# Patient Record
Sex: Male | Born: 1980 | Race: White | Hispanic: No | Marital: Single | Smoking: Current every day smoker
Health system: Southern US, Community
[De-identification: ages and names within clinical notes are randomized; demographics above are authoritative.]

## PROBLEM LIST (undated history)

## (undated) DIAGNOSIS — F71 Moderate intellectual disabilities: Secondary | ICD-10-CM

## (undated) DIAGNOSIS — N289 Disorder of kidney and ureter, unspecified: Secondary | ICD-10-CM

## (undated) DIAGNOSIS — F209 Schizophrenia, unspecified: Secondary | ICD-10-CM

## (undated) DIAGNOSIS — G4730 Sleep apnea, unspecified: Secondary | ICD-10-CM

## (undated) DIAGNOSIS — I82409 Acute embolism and thrombosis of unspecified deep veins of unspecified lower extremity: Secondary | ICD-10-CM

## (undated) DIAGNOSIS — I10 Essential (primary) hypertension: Secondary | ICD-10-CM

## (undated) DIAGNOSIS — G210 Malignant neuroleptic syndrome: Secondary | ICD-10-CM

## (undated) DIAGNOSIS — D709 Neutropenia, unspecified: Secondary | ICD-10-CM

## (undated) DIAGNOSIS — Q984 Klinefelter syndrome, unspecified: Secondary | ICD-10-CM

## (undated) DIAGNOSIS — F419 Anxiety disorder, unspecified: Secondary | ICD-10-CM

---

## 2001-12-15 ENCOUNTER — Emergency Department (HOSPITAL_COMMUNITY): Admission: EM | Admit: 2001-12-15 | Discharge: 2001-12-15 | Payer: Self-pay

## 2001-12-24 ENCOUNTER — Emergency Department (HOSPITAL_COMMUNITY): Admission: AC | Admit: 2001-12-24 | Discharge: 2001-12-24 | Payer: Self-pay

## 2002-04-30 ENCOUNTER — Emergency Department (HOSPITAL_COMMUNITY): Admission: EM | Admit: 2002-04-30 | Discharge: 2002-04-30 | Payer: Self-pay | Admitting: Emergency Medicine

## 2002-04-30 ENCOUNTER — Encounter: Payer: Self-pay | Admitting: Emergency Medicine

## 2002-10-07 ENCOUNTER — Inpatient Hospital Stay (HOSPITAL_COMMUNITY): Admission: EM | Admit: 2002-10-07 | Discharge: 2002-10-10 | Payer: Self-pay | Admitting: Emergency Medicine

## 2002-11-02 ENCOUNTER — Emergency Department (HOSPITAL_COMMUNITY): Admission: EM | Admit: 2002-11-02 | Discharge: 2002-11-03 | Payer: Self-pay | Admitting: Emergency Medicine

## 2004-06-23 ENCOUNTER — Emergency Department (HOSPITAL_COMMUNITY): Admission: EM | Admit: 2004-06-23 | Discharge: 2004-06-24 | Payer: Self-pay | Admitting: Emergency Medicine

## 2004-11-23 ENCOUNTER — Emergency Department (HOSPITAL_COMMUNITY): Admission: EM | Admit: 2004-11-23 | Discharge: 2004-11-23 | Payer: Self-pay | Admitting: Family Medicine

## 2004-12-01 ENCOUNTER — Emergency Department (HOSPITAL_COMMUNITY): Admission: EM | Admit: 2004-12-01 | Discharge: 2004-12-01 | Payer: Self-pay | Admitting: Family Medicine

## 2005-02-08 ENCOUNTER — Ambulatory Visit: Payer: Self-pay | Admitting: Pulmonary Disease

## 2005-02-28 ENCOUNTER — Ambulatory Visit (HOSPITAL_BASED_OUTPATIENT_CLINIC_OR_DEPARTMENT_OTHER): Admission: RE | Admit: 2005-02-28 | Discharge: 2005-02-28 | Payer: Self-pay | Admitting: Pulmonary Disease

## 2005-03-08 ENCOUNTER — Ambulatory Visit: Payer: Self-pay | Admitting: Pulmonary Disease

## 2005-05-15 ENCOUNTER — Emergency Department (HOSPITAL_COMMUNITY): Admission: EM | Admit: 2005-05-15 | Discharge: 2005-05-15 | Payer: Self-pay | Admitting: Family Medicine

## 2005-05-25 ENCOUNTER — Ambulatory Visit: Payer: Self-pay | Admitting: Pulmonary Disease

## 2005-06-05 ENCOUNTER — Ambulatory Visit (HOSPITAL_BASED_OUTPATIENT_CLINIC_OR_DEPARTMENT_OTHER): Admission: RE | Admit: 2005-06-05 | Discharge: 2005-06-05 | Payer: Self-pay | Admitting: Pulmonary Disease

## 2005-06-26 ENCOUNTER — Ambulatory Visit: Payer: Self-pay | Admitting: Pulmonary Disease

## 2005-07-09 ENCOUNTER — Emergency Department (HOSPITAL_COMMUNITY): Admission: EM | Admit: 2005-07-09 | Discharge: 2005-07-09 | Payer: Self-pay | Admitting: Emergency Medicine

## 2005-07-26 ENCOUNTER — Ambulatory Visit: Payer: Self-pay | Admitting: Pulmonary Disease

## 2005-09-01 ENCOUNTER — Ambulatory Visit: Payer: Self-pay | Admitting: Pulmonary Disease

## 2006-02-28 ENCOUNTER — Encounter: Admission: RE | Admit: 2006-02-28 | Discharge: 2006-02-28 | Payer: Self-pay | Admitting: Nephrology

## 2006-04-27 ENCOUNTER — Ambulatory Visit: Payer: Self-pay

## 2007-01-19 ENCOUNTER — Emergency Department (HOSPITAL_COMMUNITY): Admission: EM | Admit: 2007-01-19 | Discharge: 2007-01-19 | Payer: Self-pay | Admitting: Emergency Medicine

## 2007-01-30 DIAGNOSIS — G4730 Sleep apnea, unspecified: Secondary | ICD-10-CM | POA: Insufficient documentation

## 2007-01-30 DIAGNOSIS — G210 Malignant neuroleptic syndrome: Secondary | ICD-10-CM

## 2007-01-30 DIAGNOSIS — J45909 Unspecified asthma, uncomplicated: Secondary | ICD-10-CM | POA: Insufficient documentation

## 2007-01-30 DIAGNOSIS — F329 Major depressive disorder, single episode, unspecified: Secondary | ICD-10-CM

## 2007-01-30 DIAGNOSIS — F319 Bipolar disorder, unspecified: Secondary | ICD-10-CM

## 2007-01-30 DIAGNOSIS — Q984 Klinefelter syndrome, unspecified: Secondary | ICD-10-CM

## 2007-01-30 DIAGNOSIS — F259 Schizoaffective disorder, unspecified: Secondary | ICD-10-CM | POA: Insufficient documentation

## 2007-01-30 DIAGNOSIS — F6381 Intermittent explosive disorder: Secondary | ICD-10-CM

## 2007-04-10 ENCOUNTER — Emergency Department (HOSPITAL_COMMUNITY): Admission: EM | Admit: 2007-04-10 | Discharge: 2007-04-11 | Payer: Self-pay | Admitting: Emergency Medicine

## 2007-06-03 ENCOUNTER — Emergency Department (HOSPITAL_COMMUNITY): Admission: EM | Admit: 2007-06-03 | Discharge: 2007-06-03 | Payer: Self-pay | Admitting: Family Medicine

## 2007-06-27 ENCOUNTER — Emergency Department (HOSPITAL_COMMUNITY): Admission: EM | Admit: 2007-06-27 | Discharge: 2007-06-27 | Payer: Self-pay | Admitting: Family Medicine

## 2007-07-30 ENCOUNTER — Encounter: Payer: Self-pay | Admitting: Pulmonary Disease

## 2007-08-20 ENCOUNTER — Emergency Department (HOSPITAL_COMMUNITY): Admission: EM | Admit: 2007-08-20 | Discharge: 2007-08-20 | Payer: Self-pay | Admitting: Emergency Medicine

## 2007-08-30 ENCOUNTER — Encounter: Payer: Self-pay | Admitting: Pulmonary Disease

## 2007-09-26 ENCOUNTER — Emergency Department (HOSPITAL_COMMUNITY): Admission: EM | Admit: 2007-09-26 | Discharge: 2007-09-26 | Payer: Self-pay | Admitting: Family Medicine

## 2007-11-10 ENCOUNTER — Emergency Department (HOSPITAL_COMMUNITY): Admission: EM | Admit: 2007-11-10 | Discharge: 2007-11-10 | Payer: Self-pay | Admitting: Emergency Medicine

## 2008-05-28 ENCOUNTER — Emergency Department (HOSPITAL_COMMUNITY): Admission: EM | Admit: 2008-05-28 | Discharge: 2008-05-28 | Payer: Self-pay | Admitting: Family Medicine

## 2008-06-23 ENCOUNTER — Emergency Department (HOSPITAL_COMMUNITY): Admission: EM | Admit: 2008-06-23 | Discharge: 2008-06-24 | Payer: Self-pay | Admitting: Emergency Medicine

## 2008-09-25 ENCOUNTER — Observation Stay (HOSPITAL_COMMUNITY): Admission: EM | Admit: 2008-09-25 | Discharge: 2008-09-26 | Payer: Self-pay | Admitting: Emergency Medicine

## 2008-10-09 ENCOUNTER — Encounter (HOSPITAL_BASED_OUTPATIENT_CLINIC_OR_DEPARTMENT_OTHER): Admission: RE | Admit: 2008-10-09 | Discharge: 2008-11-18 | Payer: Self-pay | Admitting: General Surgery

## 2008-10-26 ENCOUNTER — Emergency Department (HOSPITAL_COMMUNITY): Admission: EM | Admit: 2008-10-26 | Discharge: 2008-11-04 | Payer: Self-pay | Admitting: Emergency Medicine

## 2008-10-28 ENCOUNTER — Ambulatory Visit: Payer: Self-pay | Admitting: Vascular Surgery

## 2008-10-29 ENCOUNTER — Encounter (INDEPENDENT_AMBULATORY_CARE_PROVIDER_SITE_OTHER): Payer: Self-pay | Admitting: Emergency Medicine

## 2008-10-31 ENCOUNTER — Ambulatory Visit: Payer: Self-pay | Admitting: Psychiatry

## 2008-11-20 ENCOUNTER — Encounter (HOSPITAL_BASED_OUTPATIENT_CLINIC_OR_DEPARTMENT_OTHER): Admission: RE | Admit: 2008-11-20 | Discharge: 2009-02-18 | Payer: Self-pay | Admitting: General Surgery

## 2008-11-28 ENCOUNTER — Emergency Department (HOSPITAL_COMMUNITY): Admission: EM | Admit: 2008-11-28 | Discharge: 2008-11-29 | Payer: Self-pay | Admitting: Emergency Medicine

## 2008-12-16 ENCOUNTER — Inpatient Hospital Stay (HOSPITAL_COMMUNITY): Admission: EM | Admit: 2008-12-16 | Discharge: 2008-12-18 | Payer: Self-pay | Admitting: Emergency Medicine

## 2008-12-20 ENCOUNTER — Emergency Department (HOSPITAL_COMMUNITY): Admission: EM | Admit: 2008-12-20 | Discharge: 2008-12-20 | Payer: Self-pay | Admitting: Emergency Medicine

## 2008-12-31 ENCOUNTER — Inpatient Hospital Stay (HOSPITAL_COMMUNITY): Admission: AD | Admit: 2008-12-31 | Discharge: 2008-12-31 | Payer: Self-pay | Admitting: Obstetrics & Gynecology

## 2009-01-09 ENCOUNTER — Emergency Department (HOSPITAL_COMMUNITY): Admission: EM | Admit: 2009-01-09 | Discharge: 2009-01-11 | Payer: Self-pay | Admitting: Emergency Medicine

## 2009-01-28 ENCOUNTER — Inpatient Hospital Stay (HOSPITAL_COMMUNITY): Admission: EM | Admit: 2009-01-28 | Discharge: 2009-02-06 | Payer: Self-pay | Admitting: Emergency Medicine

## 2009-03-10 ENCOUNTER — Encounter (HOSPITAL_BASED_OUTPATIENT_CLINIC_OR_DEPARTMENT_OTHER): Admission: RE | Admit: 2009-03-10 | Discharge: 2009-06-08 | Payer: Self-pay | Admitting: General Surgery

## 2009-05-26 ENCOUNTER — Emergency Department (HOSPITAL_COMMUNITY): Admission: EM | Admit: 2009-05-26 | Discharge: 2009-05-26 | Payer: Self-pay | Admitting: Emergency Medicine

## 2009-05-26 ENCOUNTER — Encounter (INDEPENDENT_AMBULATORY_CARE_PROVIDER_SITE_OTHER): Payer: Self-pay | Admitting: Internal Medicine

## 2009-05-26 ENCOUNTER — Ambulatory Visit: Payer: Self-pay | Admitting: Vascular Surgery

## 2009-06-02 ENCOUNTER — Emergency Department (HOSPITAL_COMMUNITY): Admission: EM | Admit: 2009-06-02 | Discharge: 2009-06-03 | Payer: Self-pay | Admitting: Emergency Medicine

## 2009-06-10 ENCOUNTER — Encounter (HOSPITAL_BASED_OUTPATIENT_CLINIC_OR_DEPARTMENT_OTHER): Admission: RE | Admit: 2009-06-10 | Discharge: 2009-09-08 | Payer: Self-pay | Admitting: General Surgery

## 2009-07-15 ENCOUNTER — Emergency Department (HOSPITAL_COMMUNITY): Admission: EM | Admit: 2009-07-15 | Discharge: 2009-07-15 | Payer: Self-pay | Admitting: Emergency Medicine

## 2009-08-16 ENCOUNTER — Emergency Department (HOSPITAL_COMMUNITY): Admission: EM | Admit: 2009-08-16 | Discharge: 2009-08-16 | Payer: Self-pay | Admitting: Emergency Medicine

## 2009-09-08 ENCOUNTER — Emergency Department (HOSPITAL_COMMUNITY): Admission: EM | Admit: 2009-09-08 | Discharge: 2009-09-08 | Payer: Self-pay | Admitting: Emergency Medicine

## 2009-10-29 ENCOUNTER — Emergency Department (HOSPITAL_COMMUNITY): Admission: EM | Admit: 2009-10-29 | Discharge: 2009-10-29 | Payer: Self-pay | Admitting: Emergency Medicine

## 2009-10-31 ENCOUNTER — Inpatient Hospital Stay (HOSPITAL_COMMUNITY): Admission: EM | Admit: 2009-10-31 | Discharge: 2009-11-16 | Payer: Self-pay | Admitting: Emergency Medicine

## 2009-11-15 ENCOUNTER — Ambulatory Visit: Payer: Self-pay | Admitting: Psychiatry

## 2009-12-24 ENCOUNTER — Emergency Department (HOSPITAL_COMMUNITY): Admission: EM | Admit: 2009-12-24 | Discharge: 2009-12-25 | Payer: Self-pay | Admitting: Emergency Medicine

## 2010-01-21 ENCOUNTER — Ambulatory Visit: Payer: Self-pay | Admitting: Psychiatry

## 2010-02-15 ENCOUNTER — Emergency Department (HOSPITAL_COMMUNITY)
Admission: EM | Admit: 2010-02-15 | Discharge: 2010-02-16 | Payer: Self-pay | Source: Home / Self Care | Admitting: Emergency Medicine

## 2010-03-18 ENCOUNTER — Emergency Department (HOSPITAL_BASED_OUTPATIENT_CLINIC_OR_DEPARTMENT_OTHER)
Admission: EM | Admit: 2010-03-18 | Discharge: 2010-03-18 | Payer: Self-pay | Source: Home / Self Care | Admitting: Emergency Medicine

## 2010-03-24 ENCOUNTER — Ambulatory Visit (HOSPITAL_BASED_OUTPATIENT_CLINIC_OR_DEPARTMENT_OTHER)
Admission: RE | Admit: 2010-03-24 | Discharge: 2010-03-24 | Payer: Self-pay | Source: Home / Self Care | Attending: Internal Medicine | Admitting: Internal Medicine

## 2010-05-23 LAB — COMPREHENSIVE METABOLIC PANEL
ALT: 29 U/L (ref 0–53)
AST: 28 U/L (ref 0–37)
Albumin: 4 g/dL (ref 3.5–5.2)
BUN: 16 mg/dL (ref 6–23)
Creatinine, Ser: 1.65 mg/dL — ABNORMAL HIGH (ref 0.4–1.5)
GFR calc Af Amer: 60 mL/min — ABNORMAL LOW (ref >60)
GFR calc non Af Amer: 50 mL/min — ABNORMAL LOW (ref >60)
Potassium: 4.3 mEq/L (ref 3.5–5.1)
Sodium: 146 mEq/L — ABNORMAL HIGH (ref 135–145)
Total Bilirubin: 0.7 mg/dL (ref 0.3–1.2)
Total Protein: 8 g/dL (ref 6.0–8.3)

## 2010-05-23 LAB — DIFFERENTIAL
Basophils Absolute: 0 10*3/uL (ref 0.0–0.1)
Basophils Relative: 0 % (ref 0–1)
Eosinophils Absolute: 0 10*3/uL (ref 0.0–0.7)
Lymphs Abs: 2.3 10*3/uL (ref 0.7–4.0)
Neutro Abs: 5.7 10*3/uL (ref 1.7–7.7)

## 2010-05-23 LAB — URINALYSIS, ROUTINE W REFLEX MICROSCOPIC
Bilirubin Urine: NEGATIVE
Hgb urine dipstick: NEGATIVE
Leukocytes, UA: NEGATIVE
Urobilinogen, UA: 0.2 mg/dL (ref 0.0–1.0)
pH: 6.5 (ref 5.0–8.0)

## 2010-05-23 LAB — URINE MICROSCOPIC-ADD ON

## 2010-05-23 LAB — CBC
HCT: 41.1 % (ref 39.0–52.0)
Hemoglobin: 13.9 g/dL (ref 13.0–17.0)
Platelets: 182 10*3/uL (ref 150–400)
RDW: 15.2 % (ref 11.5–15.5)
WBC: 8.6 10*3/uL (ref 4.0–10.5)

## 2010-05-23 LAB — RAPID URINE DRUG SCREEN, HOSP PERFORMED
Barbiturates: NOT DETECTED
Opiates: NOT DETECTED

## 2010-05-23 LAB — ACETAMINOPHEN LEVEL: Acetaminophen (Tylenol), Serum: <10 ug/mL — ABNORMAL LOW (ref 10–30)

## 2010-05-26 LAB — CBC
HCT: 29.9 % — ABNORMAL LOW (ref 39.0–52.0)
HCT: 30.4 % — ABNORMAL LOW (ref 39.0–52.0)
HCT: 33 % — ABNORMAL LOW (ref 39.0–52.0)
Hemoglobin: 10.1 g/dL — ABNORMAL LOW (ref 13.0–17.0)
Hemoglobin: 11.1 g/dL — ABNORMAL LOW (ref 13.0–17.0)
Hemoglobin: 11.4 g/dL — ABNORMAL LOW (ref 13.0–17.0)
MCH: 26.2 pg (ref 26.0–34.0)
MCH: 26.5 pg (ref 26.0–34.0)
MCH: 26.6 pg (ref 26.0–34.0)
MCH: 26.7 pg (ref 26.0–34.0)
MCHC: 33.6 g/dL (ref 30.0–36.0)
MCHC: 34 g/dL (ref 30.0–36.0)
MCHC: 34 g/dL (ref 30.0–36.0)
MCV: 77.8 fL — ABNORMAL LOW (ref 78.0–100.0)
MCV: 78.4 fL (ref 78.0–100.0)
Platelets: 124 10*3/uL — ABNORMAL LOW (ref 150–400)
Platelets: 128 10*3/uL — ABNORMAL LOW (ref 150–400)
RBC: 3.85 MIL/uL — ABNORMAL LOW (ref 4.22–5.81)
RBC: 3.89 MIL/uL — ABNORMAL LOW (ref 4.22–5.81)
RBC: 4.21 MIL/uL — ABNORMAL LOW (ref 4.22–5.81)
RDW: 16.6 % — ABNORMAL HIGH (ref 11.5–15.5)
RDW: 16.7 % — ABNORMAL HIGH (ref 11.5–15.5)
RDW: 17 % — ABNORMAL HIGH (ref 11.5–15.5)
WBC: 4.5 10*3/uL (ref 4.0–10.5)
WBC: 4.6 10*3/uL (ref 4.0–10.5)

## 2010-05-26 LAB — RENAL FUNCTION PANEL
BUN: 24 mg/dL — ABNORMAL HIGH (ref 6–23)
BUN: 24 mg/dL — ABNORMAL HIGH (ref 6–23)
CO2: 22 mEq/L (ref 19–32)
CO2: 24 mEq/L (ref 19–32)
Calcium: 8.8 mg/dL (ref 8.4–10.5)
Calcium: 9.3 mg/dL (ref 8.4–10.5)
Calcium: 9.4 mg/dL (ref 8.4–10.5)
Chloride: 117 mEq/L — ABNORMAL HIGH (ref 96–112)
GFR calc Af Amer: 16 mL/min — ABNORMAL LOW (ref >60)
GFR calc Af Amer: 16 mL/min — ABNORMAL LOW (ref >60)
GFR calc non Af Amer: 13 mL/min — ABNORMAL LOW (ref >60)
GFR calc non Af Amer: 13 mL/min — ABNORMAL LOW (ref >60)
Glucose, Bld: 100 mg/dL — ABNORMAL HIGH (ref 70–99)
Glucose, Bld: 108 mg/dL — ABNORMAL HIGH (ref 70–99)
Glucose, Bld: 93 mg/dL (ref 70–99)
Phosphorus: 5 mg/dL — ABNORMAL HIGH (ref 2.3–4.6)
Phosphorus: 5.4 mg/dL — ABNORMAL HIGH (ref 2.3–4.6)
Phosphorus: 6 mg/dL — ABNORMAL HIGH (ref 2.3–4.6)
Potassium: 4 mEq/L (ref 3.5–5.1)
Potassium: 4.7 mEq/L (ref 3.5–5.1)
Sodium: 147 mEq/L — ABNORMAL HIGH (ref 135–145)
Sodium: 148 mEq/L — ABNORMAL HIGH (ref 135–145)

## 2010-05-26 LAB — PROTIME-INR
INR: 3.2 — ABNORMAL HIGH (ref 0.00–1.49)
Prothrombin Time: 18 seconds — ABNORMAL HIGH (ref 11.6–15.2)
Prothrombin Time: 18.5 seconds — ABNORMAL HIGH (ref 11.6–15.2)
Prothrombin Time: 18.9 seconds — ABNORMAL HIGH (ref 11.6–15.2)

## 2010-05-26 LAB — BASIC METABOLIC PANEL
CO2: 20 mEq/L (ref 19–32)
CO2: 25 mEq/L (ref 19–32)
Calcium: 8.6 mg/dL (ref 8.4–10.5)
Calcium: 9.1 mg/dL (ref 8.4–10.5)
Chloride: 115 mEq/L — ABNORMAL HIGH (ref 96–112)
Chloride: 123 mEq/L — ABNORMAL HIGH (ref 96–112)
Creatinine, Ser: 5.32 mg/dL — ABNORMAL HIGH (ref 0.4–1.5)
GFR calc non Af Amer: 13 mL/min — ABNORMAL LOW (ref >60)
Glucose, Bld: 90 mg/dL (ref 70–99)
Glucose, Bld: 96 mg/dL (ref 70–99)
Sodium: 145 mEq/L (ref 135–145)

## 2010-05-26 LAB — DIFFERENTIAL
Basophils Absolute: 0 10*3/uL (ref 0.0–0.1)
Lymphocytes Relative: 38 % (ref 12–46)
Lymphs Abs: 1.7 10*3/uL (ref 0.7–4.0)
Monocytes Absolute: 0.4 10*3/uL (ref 0.1–1.0)
Neutro Abs: 2.4 10*3/uL (ref 1.7–7.7)

## 2010-05-26 LAB — ALBUMIN: Albumin: 3 g/dL — ABNORMAL LOW (ref 3.5–5.2)

## 2010-05-27 LAB — DIFFERENTIAL
Band Neutrophils: 0 % (ref 0–10)
Basophils Absolute: 0 10*3/uL (ref 0.0–0.1)
Basophils Absolute: 0 10*3/uL (ref 0.0–0.1)
Basophils Absolute: 0 10*3/uL (ref 0.0–0.1)
Basophils Absolute: 0 10*3/uL (ref 0.0–0.1)
Basophils Absolute: 0 10*3/uL (ref 0.0–0.1)
Basophils Absolute: 0 10*3/uL (ref 0.0–0.1)
Basophils Relative: 0 % (ref 0–1)
Basophils Relative: 0 % (ref 0–1)
Basophils Relative: 0 % (ref 0–1)
Eosinophils Absolute: 0 10*3/uL (ref 0.0–0.7)
Eosinophils Absolute: 0 10*3/uL (ref 0.0–0.7)
Eosinophils Absolute: 0 10*3/uL (ref 0.0–0.7)
Eosinophils Absolute: 0 10*3/uL (ref 0.0–0.7)
Eosinophils Absolute: 0 10*3/uL (ref 0.0–0.7)
Eosinophils Relative: 0 % (ref 0–5)
Eosinophils Relative: 0 % (ref 0–5)
Eosinophils Relative: 0 % (ref 0–5)
Eosinophils Relative: 0 % (ref 0–5)
Lymphocytes Relative: 0 % — ABNORMAL LOW (ref 12–46)
Lymphocytes Relative: 22 % (ref 12–46)
Lymphocytes Relative: 88 % — ABNORMAL HIGH (ref 12–46)
Lymphocytes Relative: 98 % — ABNORMAL HIGH (ref 12–46)
Lymphs Abs: 0 10*3/uL — ABNORMAL LOW (ref 0.7–4.0)
Lymphs Abs: 1.2 10*3/uL (ref 0.7–4.0)
Lymphs Abs: 1.3 10*3/uL (ref 0.7–4.0)
Lymphs Abs: 1.5 10*3/uL (ref 0.7–4.0)
Monocytes Absolute: 0 10*3/uL — ABNORMAL LOW (ref 0.1–1.0)
Monocytes Absolute: 0 10*3/uL — ABNORMAL LOW (ref 0.1–1.0)
Monocytes Absolute: 0 10*3/uL — ABNORMAL LOW (ref 0.1–1.0)
Monocytes Relative: 0 % — ABNORMAL LOW (ref 3–12)
Monocytes Relative: 1 % — ABNORMAL LOW (ref 3–12)
Monocytes Relative: 6 % (ref 3–12)
Monocytes Relative: 8 % (ref 3–12)
Neutro Abs: 0 10*3/uL — ABNORMAL LOW (ref 1.7–7.7)
Neutro Abs: 0.1 10*3/uL — ABNORMAL LOW (ref 1.7–7.7)
Neutro Abs: 0.1 10*3/uL — ABNORMAL LOW (ref 1.7–7.7)
Neutro Abs: 8.4 10*3/uL — ABNORMAL HIGH (ref 1.7–7.7)
Neutrophils Relative %: 0 % — ABNORMAL LOW (ref 43–77)
Neutrophils Relative %: 1 % — ABNORMAL LOW (ref 43–77)
Neutrophils Relative %: 4 % — ABNORMAL LOW (ref 43–77)
Neutrophils Relative %: 4 % — ABNORMAL LOW (ref 43–77)
Neutrophils Relative %: 67 % (ref 43–77)
Neutrophils Relative %: 77 % (ref 43–77)
WBC Morphology: INCREASED
nRBC: 0 /100 WBC

## 2010-05-27 LAB — CBC
HCT: 32.5 % — ABNORMAL LOW (ref 39.0–52.0)
HCT: 32.6 % — ABNORMAL LOW (ref 39.0–52.0)
HCT: 33.4 % — ABNORMAL LOW (ref 39.0–52.0)
HCT: 35.5 % — ABNORMAL LOW (ref 39.0–52.0)
HCT: 36.6 % — ABNORMAL LOW (ref 39.0–52.0)
HCT: 39.6 % (ref 39.0–52.0)
Hemoglobin: 10.9 g/dL — ABNORMAL LOW (ref 13.0–17.0)
Hemoglobin: 11.6 g/dL — ABNORMAL LOW (ref 13.0–17.0)
Hemoglobin: 12.2 g/dL — ABNORMAL LOW (ref 13.0–17.0)
Hemoglobin: 12.3 g/dL — ABNORMAL LOW (ref 13.0–17.0)
Hemoglobin: 13.6 g/dL (ref 13.0–17.0)
MCH: 26.3 pg (ref 26.0–34.0)
MCH: 26.7 pg (ref 26.0–34.0)
MCH: 27.3 pg (ref 26.0–34.0)
MCH: 27.6 pg (ref 26.0–34.0)
MCHC: 33.2 g/dL (ref 30.0–36.0)
MCHC: 33.6 g/dL (ref 30.0–36.0)
MCHC: 33.7 g/dL (ref 30.0–36.0)
MCHC: 34 g/dL (ref 30.0–36.0)
MCHC: 34.2 g/dL (ref 30.0–36.0)
MCHC: 34.3 g/dL (ref 30.0–36.0)
MCHC: 34.4 g/dL (ref 30.0–36.0)
MCHC: 34.6 g/dL (ref 30.0–36.0)
MCV: 78.8 fL (ref 78.0–100.0)
MCV: 79.1 fL (ref 78.0–100.0)
MCV: 79.4 fL (ref 78.0–100.0)
MCV: 79.4 fL (ref 78.0–100.0)
MCV: 79.7 fL (ref 78.0–100.0)
Platelets: 102 10*3/uL — ABNORMAL LOW (ref 150–400)
Platelets: 156 10*3/uL (ref 150–400)
Platelets: 172 10*3/uL (ref 150–400)
Platelets: 186 10*3/uL (ref 150–400)
RBC: 3.94 MIL/uL — ABNORMAL LOW (ref 4.22–5.81)
RBC: 4.48 MIL/uL (ref 4.22–5.81)
RBC: 4.57 MIL/uL (ref 4.22–5.81)
RDW: 15.7 % — ABNORMAL HIGH (ref 11.5–15.5)
RDW: 15.7 % — ABNORMAL HIGH (ref 11.5–15.5)
RDW: 15.8 % — ABNORMAL HIGH (ref 11.5–15.5)
RDW: 16 % — ABNORMAL HIGH (ref 11.5–15.5)
RDW: 16.1 % — ABNORMAL HIGH (ref 11.5–15.5)
RDW: 16.1 % — ABNORMAL HIGH (ref 11.5–15.5)
RDW: 16.3 % — ABNORMAL HIGH (ref 11.5–15.5)
WBC: 0.7 10*3/uL — CL (ref 4.0–10.5)
WBC: 1.1 10*3/uL — CL (ref 4.0–10.5)
WBC: 1.2 10*3/uL — CL (ref 4.0–10.5)
WBC: 1.3 10*3/uL — CL (ref 4.0–10.5)
WBC: 10.9 10*3/uL — ABNORMAL HIGH (ref 4.0–10.5)
WBC: 2.2 10*3/uL — ABNORMAL LOW (ref 4.0–10.5)

## 2010-05-27 LAB — BASIC METABOLIC PANEL
BUN: 10 mg/dL (ref 6–23)
BUN: 10 mg/dL (ref 6–23)
BUN: 11 mg/dL (ref 6–23)
BUN: 9 mg/dL (ref 6–23)
BUN: 9 mg/dL (ref 6–23)
CO2: 25 mEq/L (ref 19–32)
Calcium: 8.1 mg/dL — ABNORMAL LOW (ref 8.4–10.5)
Calcium: 8.5 mg/dL (ref 8.4–10.5)
Calcium: 8.7 mg/dL (ref 8.4–10.5)
Calcium: 8.9 mg/dL (ref 8.4–10.5)
Chloride: 109 mEq/L (ref 96–112)
Chloride: 113 mEq/L — ABNORMAL HIGH (ref 96–112)
Creatinine, Ser: 1.42 mg/dL (ref 0.4–1.5)
Creatinine, Ser: 1.45 mg/dL (ref 0.4–1.5)
Creatinine, Ser: 5.04 mg/dL — ABNORMAL HIGH (ref 0.4–1.5)
Creatinine, Ser: 5.54 mg/dL — ABNORMAL HIGH (ref 0.4–1.5)
Creatinine, Ser: 5.79 mg/dL — ABNORMAL HIGH (ref 0.4–1.5)
GFR calc Af Amer: 15 mL/min — ABNORMAL LOW (ref >60)
GFR calc Af Amer: >60 mL/min (ref >60)
GFR calc non Af Amer: 12 mL/min — ABNORMAL LOW (ref >60)
GFR calc non Af Amer: 12 mL/min — ABNORMAL LOW (ref >60)
GFR calc non Af Amer: 14 mL/min — ABNORMAL LOW (ref >60)
GFR calc non Af Amer: 58 mL/min — ABNORMAL LOW (ref >60)
GFR calc non Af Amer: 59 mL/min — ABNORMAL LOW (ref >60)
Glucose, Bld: 101 mg/dL — ABNORMAL HIGH (ref 70–99)
Glucose, Bld: 104 mg/dL — ABNORMAL HIGH (ref 70–99)
Glucose, Bld: 129 mg/dL — ABNORMAL HIGH (ref 70–99)
Potassium: 4.1 mEq/L (ref 3.5–5.1)
Potassium: 4.2 mEq/L (ref 3.5–5.1)
Sodium: 144 mEq/L (ref 135–145)
Sodium: 146 mEq/L — ABNORMAL HIGH (ref 135–145)

## 2010-05-27 LAB — URINE CULTURE
Colony Count: NO GROWTH
Culture  Setup Time: 201108300114

## 2010-05-27 LAB — RENAL FUNCTION PANEL
Albumin: 2.6 g/dL — ABNORMAL LOW (ref 3.5–5.2)
BUN: 14 mg/dL (ref 6–23)
Chloride: 121 mEq/L — ABNORMAL HIGH (ref 96–112)
Glucose, Bld: 97 mg/dL (ref 70–99)
Potassium: 4.3 mEq/L (ref 3.5–5.1)

## 2010-05-27 LAB — URINALYSIS, ROUTINE W REFLEX MICROSCOPIC
Bilirubin Urine: NEGATIVE
Bilirubin Urine: NEGATIVE
Glucose, UA: NEGATIVE mg/dL
Hgb urine dipstick: NEGATIVE
Hgb urine dipstick: NEGATIVE
Ketones, ur: NEGATIVE mg/dL
Ketones, ur: NEGATIVE mg/dL
Leukocytes, UA: NEGATIVE
Nitrite: NEGATIVE
Protein, ur: 100 mg/dL — AB
Protein, ur: NEGATIVE mg/dL
Protein, ur: NEGATIVE mg/dL
Urobilinogen, UA: 0.2 mg/dL (ref 0.0–1.0)
pH: 7 (ref 5.0–8.0)
pH: 7 (ref 5.0–8.0)

## 2010-05-27 LAB — HEPARIN INDUCED THROMBOCYTOPENIA PNL
UFH High Dose UFH H: 0 % Release
UFH Low Dose 0.1 IU/mL: 0 % Release
UFH Low Dose 0.5 IU/mL: 0 % Release
UFH SRA Result: NEGATIVE

## 2010-05-27 LAB — PROTIME-INR
INR: 2.68 — ABNORMAL HIGH (ref 0.00–1.49)
INR: 3.25 — ABNORMAL HIGH (ref 0.00–1.49)
INR: 3.42 — ABNORMAL HIGH (ref 0.00–1.49)
INR: 3.67 — ABNORMAL HIGH (ref 0.00–1.49)
Prothrombin Time: 19.7 seconds — ABNORMAL HIGH (ref 11.6–15.2)
Prothrombin Time: 25.4 seconds — ABNORMAL HIGH (ref 11.6–15.2)
Prothrombin Time: 28.6 seconds — ABNORMAL HIGH (ref 11.6–15.2)
Prothrombin Time: 33.1 seconds — ABNORMAL HIGH (ref 11.6–15.2)
Prothrombin Time: 36.4 seconds — ABNORMAL HIGH (ref 11.6–15.2)

## 2010-05-27 LAB — COMPREHENSIVE METABOLIC PANEL
ALT: 19 U/L (ref 0–53)
AST: 19 U/L (ref 0–37)
Alkaline Phosphatase: 114 U/L (ref 39–117)
CO2: 26 mEq/L (ref 19–32)
Chloride: 111 mEq/L (ref 96–112)
GFR calc Af Amer: >60 mL/min (ref >60)
GFR calc non Af Amer: 52 mL/min — ABNORMAL LOW (ref >60)
Glucose, Bld: 121 mg/dL — ABNORMAL HIGH (ref 70–99)
Potassium: 4.6 mEq/L (ref 3.5–5.1)
Sodium: 143 mEq/L (ref 135–145)

## 2010-05-27 LAB — TSH: TSH: 0.169 u[IU]/mL — ABNORMAL LOW (ref 0.350–4.500)

## 2010-05-27 LAB — URINE MICROSCOPIC-ADD ON

## 2010-05-27 LAB — CREATININE, SERUM
Creatinine, Ser: 4.53 mg/dL — ABNORMAL HIGH (ref 0.4–1.5)
GFR calc Af Amer: 19 mL/min — ABNORMAL LOW (ref >60)

## 2010-05-27 LAB — VANCOMYCIN, TROUGH: Vancomycin Tr: 42.8 ug/mL (ref 10.0–20.0)

## 2010-05-27 LAB — CULTURE, BLOOD (ROUTINE X 2)
Culture: NO GROWTH
Culture: NO GROWTH

## 2010-05-27 LAB — VANCOMYCIN, RANDOM: Vancomycin Rm: 35.1 ug/mL

## 2010-05-27 LAB — T3, FREE: T3, Free: 2.4 pg/mL (ref 2.3–4.2)

## 2010-05-27 LAB — RAPID URINE DRUG SCREEN, HOSP PERFORMED
Barbiturates: NOT DETECTED
Benzodiazepines: POSITIVE — AB

## 2010-05-27 LAB — CREATININE, URINE, RANDOM: Creatinine, Urine: 44.8 mg/dL

## 2010-05-27 LAB — APTT: aPTT: 50 seconds — ABNORMAL HIGH (ref 24–37)

## 2010-05-27 LAB — GLUCOSE, CAPILLARY: Glucose-Capillary: 101 mg/dL — ABNORMAL HIGH (ref 70–99)

## 2010-05-27 LAB — LACTIC ACID, PLASMA: Lactic Acid, Venous: 0.8 mmol/L (ref 0.5–2.2)

## 2010-05-30 LAB — POCT I-STAT, CHEM 8
BUN: 19 mg/dL (ref 6–23)
Calcium, Ion: 1.18 mmol/L (ref 1.12–1.32)
Chloride: 111 meq/L (ref 96–112)
Creatinine, Ser: 1.5 mg/dL (ref 0.4–1.5)
Glucose, Bld: 112 mg/dL — ABNORMAL HIGH (ref 70–99)
HCT: 38 % — ABNORMAL LOW (ref 39.0–52.0)
Hemoglobin: 12.9 g/dL — ABNORMAL LOW (ref 13.0–17.0)
Potassium: 4.2 meq/L (ref 3.5–5.1)
Sodium: 146 mEq/L — ABNORMAL HIGH (ref 135–145)
TCO2: 30 mmol/L (ref 0–100)

## 2010-05-30 LAB — URINALYSIS, ROUTINE W REFLEX MICROSCOPIC
Bilirubin Urine: NEGATIVE
Glucose, UA: NEGATIVE mg/dL
Hgb urine dipstick: NEGATIVE
Ketones, ur: NEGATIVE mg/dL
Leukocytes, UA: NEGATIVE
Nitrite: NEGATIVE
Protein, ur: 100 mg/dL — AB
Specific Gravity, Urine: 1.01 (ref 1.005–1.030)
Urobilinogen, UA: 1 mg/dL (ref 0.0–1.0)
pH: 7.5 (ref 5.0–8.0)

## 2010-05-30 LAB — DIFFERENTIAL
Basophils Absolute: 0 10*3/uL (ref 0.0–0.1)
Basophils Relative: 0 % (ref 0–1)
Eosinophils Absolute: 0 K/uL (ref 0.0–0.7)
Eosinophils Relative: 0 % (ref 0–5)
Lymphocytes Relative: 31 % (ref 12–46)
Lymphs Abs: 1.6 K/uL (ref 0.7–4.0)
Monocytes Absolute: 0.5 K/uL (ref 0.1–1.0)
Monocytes Relative: 9 % (ref 3–12)
Neutro Abs: 3.1 10*3/uL (ref 1.7–7.7)
Neutrophils Relative %: 60 % (ref 43–77)

## 2010-05-30 LAB — CBC
HCT: 38.5 % — ABNORMAL LOW (ref 39.0–52.0)
Hemoglobin: 12.9 g/dL — ABNORMAL LOW (ref 13.0–17.0)
MCHC: 33.4 g/dL (ref 30.0–36.0)
MCV: 83.2 fL (ref 78.0–100.0)
Platelets: 182 10*3/uL (ref 150–400)
RBC: 4.63 MIL/uL (ref 4.22–5.81)
RDW: 15.8 % — ABNORMAL HIGH (ref 11.5–15.5)
WBC: 5.2 K/uL (ref 4.0–10.5)

## 2010-05-30 LAB — URINE MICROSCOPIC-ADD ON

## 2010-05-30 LAB — PROTIME-INR
INR: 2.12 — ABNORMAL HIGH (ref 0.00–1.49)
Prothrombin Time: 23.6 seconds — ABNORMAL HIGH (ref 11.6–15.2)

## 2010-05-31 LAB — COMPREHENSIVE METABOLIC PANEL
ALT: 29 U/L (ref 0–53)
AST: 26 U/L (ref 0–37)
Albumin: 3.5 g/dL (ref 3.5–5.2)
CO2: 28 mEq/L (ref 19–32)
Chloride: 111 mEq/L (ref 96–112)
GFR calc Af Amer: >60 mL/min (ref >60)
GFR calc non Af Amer: >60 mL/min (ref >60)
Potassium: 4.1 mEq/L (ref 3.5–5.1)
Sodium: 145 mEq/L (ref 135–145)
Total Bilirubin: 0.7 mg/dL (ref 0.3–1.2)

## 2010-05-31 LAB — CBC
Platelets: 139 10*3/uL — ABNORMAL LOW (ref 150–400)
RBC: 4.27 MIL/uL (ref 4.22–5.81)
WBC: 4.4 10*3/uL (ref 4.0–10.5)

## 2010-05-31 LAB — LIPASE, BLOOD: Lipase: 40 U/L (ref 11–59)

## 2010-05-31 LAB — URINALYSIS, ROUTINE W REFLEX MICROSCOPIC
Glucose, UA: NEGATIVE mg/dL
Hgb urine dipstick: NEGATIVE
Leukocytes, UA: NEGATIVE
Protein, ur: 100 mg/dL — AB
pH: 6 (ref 5.0–8.0)

## 2010-05-31 LAB — DIFFERENTIAL
Basophils Absolute: 0 10*3/uL (ref 0.0–0.1)
Eosinophils Absolute: 0 10*3/uL (ref 0.0–0.7)
Eosinophils Relative: 0 % (ref 0–5)
Lymphs Abs: 1.4 10*3/uL (ref 0.7–4.0)
Monocytes Absolute: 0.4 10*3/uL (ref 0.1–1.0)

## 2010-05-31 LAB — URINE MICROSCOPIC-ADD ON

## 2010-06-05 LAB — APTT: aPTT: 42 seconds — ABNORMAL HIGH (ref 24–37)

## 2010-06-05 LAB — RAPID URINE DRUG SCREEN, HOSP PERFORMED
Amphetamines: NOT DETECTED
Barbiturates: NOT DETECTED
Cocaine: NOT DETECTED
Opiates: NOT DETECTED

## 2010-06-05 LAB — BASIC METABOLIC PANEL
BUN: 15 mg/dL (ref 6–23)
Calcium: 8.8 mg/dL (ref 8.4–10.5)
Chloride: 112 mEq/L (ref 96–112)
Creatinine, Ser: 1.26 mg/dL (ref 0.4–1.5)
GFR calc Af Amer: >60 mL/min (ref >60)
GFR calc non Af Amer: >60 mL/min (ref >60)

## 2010-06-05 LAB — CBC
HCT: 37.8 % — ABNORMAL LOW (ref 39.0–52.0)
MCV: 85.8 fL (ref 78.0–100.0)
Platelets: 142 10*3/uL — ABNORMAL LOW (ref 150–400)
Platelets: 154 10*3/uL (ref 150–400)
RBC: 4.14 MIL/uL — ABNORMAL LOW (ref 4.22–5.81)
RDW: 15.4 % (ref 11.5–15.5)
WBC: 4.5 10*3/uL (ref 4.0–10.5)
WBC: 4.7 10*3/uL (ref 4.0–10.5)

## 2010-06-05 LAB — GLUCOSE, CAPILLARY: Glucose-Capillary: 124 mg/dL — ABNORMAL HIGH (ref 70–99)

## 2010-06-05 LAB — DIFFERENTIAL
Basophils Absolute: 0 10*3/uL (ref 0.0–0.1)
Eosinophils Absolute: 0 10*3/uL (ref 0.0–0.7)
Eosinophils Relative: 0 % (ref 0–5)
Lymphocytes Relative: 26 % (ref 12–46)
Lymphs Abs: 1.2 10*3/uL (ref 0.7–4.0)
Lymphs Abs: 1.3 10*3/uL (ref 0.7–4.0)
Monocytes Relative: 10 % (ref 3–12)
Neutro Abs: 2.7 10*3/uL (ref 1.7–7.7)
Neutro Abs: 3.1 10*3/uL (ref 1.7–7.7)
Neutrophils Relative %: 60 % (ref 43–77)
Neutrophils Relative %: 65 % (ref 43–77)

## 2010-06-05 LAB — PROTIME-INR
INR: 1.52 — ABNORMAL HIGH (ref 0.00–1.49)
Prothrombin Time: 18.2 seconds — ABNORMAL HIGH (ref 11.6–15.2)

## 2010-06-05 LAB — ETHANOL: Alcohol, Ethyl (B): <5 mg/dL (ref 0–10)

## 2010-06-15 LAB — COMPREHENSIVE METABOLIC PANEL
ALT: 29 U/L (ref 0–53)
AST: 32 U/L (ref 0–37)
CO2: 31 mEq/L (ref 19–32)
Chloride: 112 mEq/L (ref 96–112)
Creatinine, Ser: 1.61 mg/dL — ABNORMAL HIGH (ref 0.4–1.5)
GFR calc Af Amer: >60 mL/min (ref >60)
GFR calc non Af Amer: 51 mL/min — ABNORMAL LOW (ref >60)
Glucose, Bld: 100 mg/dL — ABNORMAL HIGH (ref 70–99)
Sodium: 145 mEq/L (ref 135–145)
Total Bilirubin: 0.4 mg/dL (ref 0.3–1.2)

## 2010-06-15 LAB — CBC
HCT: 32.4 % — ABNORMAL LOW (ref 39.0–52.0)
HCT: 36 % — ABNORMAL LOW (ref 39.0–52.0)
Hemoglobin: 11.8 g/dL — ABNORMAL LOW (ref 13.0–17.0)
Hemoglobin: 12 g/dL — ABNORMAL LOW (ref 13.0–17.0)
Hemoglobin: 12.5 g/dL — ABNORMAL LOW (ref 13.0–17.0)
MCHC: 33.7 g/dL (ref 30.0–36.0)
MCHC: 34 g/dL (ref 30.0–36.0)
MCV: 87 fL (ref 78.0–100.0)
MCV: 87.1 fL (ref 78.0–100.0)
MCV: 87.4 fL (ref 78.0–100.0)
MCV: 87.7 fL (ref 78.0–100.0)
MCV: 89.1 fL (ref 78.0–100.0)
Platelets: 110 10*3/uL — ABNORMAL LOW (ref 150–400)
Platelets: 132 10*3/uL — ABNORMAL LOW (ref 150–400)
Platelets: 142 10*3/uL — ABNORMAL LOW (ref 150–400)
RBC: 3.99 MIL/uL — ABNORMAL LOW (ref 4.22–5.81)
RBC: 4.03 MIL/uL — ABNORMAL LOW (ref 4.22–5.81)
RBC: 4.14 MIL/uL — ABNORMAL LOW (ref 4.22–5.81)
RDW: 14.8 % (ref 11.5–15.5)
RDW: 14.9 % (ref 11.5–15.5)
WBC: 3.8 10*3/uL — ABNORMAL LOW (ref 4.0–10.5)
WBC: 3.9 10*3/uL — ABNORMAL LOW (ref 4.0–10.5)
WBC: 4.5 10*3/uL (ref 4.0–10.5)
WBC: 4.8 10*3/uL (ref 4.0–10.5)
WBC: 4 10*3/uL (ref 4.0–10.5)

## 2010-06-15 LAB — WOUND CULTURE: Gram Stain: NONE SEEN

## 2010-06-15 LAB — ANAEROBIC CULTURE

## 2010-06-15 LAB — PROTIME-INR
INR: 1.98 — ABNORMAL HIGH (ref 0.00–1.49)
INR: 2.28 — ABNORMAL HIGH (ref 0.00–1.49)
INR: 2.3 — ABNORMAL HIGH (ref 0.00–1.49)
INR: 2.32 — ABNORMAL HIGH (ref 0.00–1.49)
INR: 2.37 — ABNORMAL HIGH (ref 0.00–1.49)
Prothrombin Time: 20.6 seconds — ABNORMAL HIGH (ref 11.6–15.2)
Prothrombin Time: 24.7 seconds — ABNORMAL HIGH (ref 11.6–15.2)
Prothrombin Time: 24.9 seconds — ABNORMAL HIGH (ref 11.6–15.2)
Prothrombin Time: 25.3 seconds — ABNORMAL HIGH (ref 11.6–15.2)
Prothrombin Time: 25.7 seconds — ABNORMAL HIGH (ref 11.6–15.2)
Prothrombin Time: 26.8 seconds — ABNORMAL HIGH (ref 11.6–15.2)
Prothrombin Time: 22.4 seconds — ABNORMAL HIGH (ref 11.6–15.2)

## 2010-06-15 LAB — BASIC METABOLIC PANEL
BUN: 16 mg/dL (ref 6–23)
BUN: 16 mg/dL (ref 6–23)
BUN: 25 mg/dL — ABNORMAL HIGH (ref 6–23)
CO2: 30 mEq/L (ref 19–32)
CO2: 31 mEq/L (ref 19–32)
Calcium: 8.6 mg/dL (ref 8.4–10.5)
Calcium: 9.1 mg/dL (ref 8.4–10.5)
Chloride: 108 mEq/L (ref 96–112)
Chloride: 113 mEq/L — ABNORMAL HIGH (ref 96–112)
Creatinine, Ser: 1.23 mg/dL (ref 0.4–1.5)
Creatinine, Ser: 1.33 mg/dL (ref 0.4–1.5)
Creatinine, Ser: 1.42 mg/dL (ref 0.4–1.5)
GFR calc Af Amer: >60 mL/min (ref >60)
GFR calc Af Amer: >60 mL/min (ref >60)
GFR calc non Af Amer: >60 mL/min (ref >60)
Glucose, Bld: 113 mg/dL — ABNORMAL HIGH (ref 70–99)
Potassium: 4.4 mEq/L (ref 3.5–5.1)
Potassium: 4.6 mEq/L (ref 3.5–5.1)
Sodium: 147 mEq/L — ABNORMAL HIGH (ref 135–145)

## 2010-06-15 LAB — DIFFERENTIAL
Basophils Absolute: 0 10*3/uL (ref 0.0–0.1)
Basophils Absolute: 0 10*3/uL (ref 0.0–0.1)
Basophils Absolute: 0 10*3/uL (ref 0.0–0.1)
Basophils Relative: 0 % (ref 0–1)
Eosinophils Absolute: 0 10*3/uL (ref 0.0–0.7)
Eosinophils Absolute: 0 10*3/uL (ref 0.0–0.7)
Eosinophils Relative: 0 % (ref 0–5)
Eosinophils Relative: 0 % (ref 0–5)
Eosinophils Relative: 0 % (ref 0–5)
Lymphocytes Relative: 25 % (ref 12–46)
Monocytes Absolute: 0.2 10*3/uL (ref 0.1–1.0)
Monocytes Absolute: 0.3 10*3/uL (ref 0.1–1.0)
Neutrophils Relative %: 75 % (ref 43–77)
Neutrophils Relative %: 63 % (ref 43–77)

## 2010-06-15 LAB — MRSA CULTURE

## 2010-06-15 LAB — RAPID URINE DRUG SCREEN, HOSP PERFORMED
Amphetamines: NOT DETECTED
Barbiturates: NOT DETECTED
Opiates: NOT DETECTED

## 2010-06-15 LAB — CREATININE, SERUM
Creatinine, Ser: 1.4 mg/dL (ref 0.4–1.5)
GFR calc non Af Amer: >60 mL/min (ref >60)

## 2010-06-15 LAB — VANCOMYCIN, TROUGH: Vancomycin Tr: 18.7 ug/mL (ref 10.0–20.0)

## 2010-06-16 LAB — DIFFERENTIAL
Basophils Absolute: 0 10*3/uL (ref 0.0–0.1)
Basophils Absolute: 0 10*3/uL (ref 0.0–0.1)
Basophils Absolute: 0 10*3/uL (ref 0.0–0.1)
Basophils Relative: 0 % (ref 0–1)
Basophils Relative: 0 % (ref 0–1)
Eosinophils Relative: 0 % (ref 0–5)
Lymphocytes Relative: 34 % (ref 12–46)
Lymphocytes Relative: 17 % (ref 12–46)
Lymphocytes Relative: 30 % (ref 12–46)
Lymphocytes Relative: 31 % (ref 12–46)
Lymphs Abs: 1.4 10*3/uL (ref 0.7–4.0)
Monocytes Absolute: 0.4 10*3/uL (ref 0.1–1.0)
Monocytes Absolute: 0.5 10*3/uL (ref 0.1–1.0)
Neutro Abs: 3.2 10*3/uL (ref 1.7–7.7)
Neutro Abs: 2.1 10*3/uL (ref 1.7–7.7)
Neutro Abs: 2.3 10*3/uL (ref 1.7–7.7)
Neutro Abs: 2.7 10*3/uL (ref 1.7–7.7)
Neutrophils Relative %: 57 % (ref 43–77)
Neutrophils Relative %: 59 % (ref 43–77)

## 2010-06-16 LAB — CBC
HCT: 34.4 % — ABNORMAL LOW (ref 39.0–52.0)
HCT: 36.2 % — ABNORMAL LOW (ref 39.0–52.0)
Hemoglobin: 12.9 g/dL — ABNORMAL LOW (ref 13.0–17.0)
Hemoglobin: 11.8 g/dL — ABNORMAL LOW (ref 13.0–17.0)
MCHC: 34.3 g/dL (ref 30.0–36.0)
MCV: 87.6 fL (ref 78.0–100.0)
MCV: 88.2 fL (ref 78.0–100.0)
Platelets: 109 10*3/uL — ABNORMAL LOW (ref 150–400)
Platelets: 113 10*3/uL — ABNORMAL LOW (ref 150–400)
Platelets: 90 10*3/uL — ABNORMAL LOW (ref 150–400)
RBC: 4.32 MIL/uL (ref 4.22–5.81)
RBC: 3.93 MIL/uL — ABNORMAL LOW (ref 4.22–5.81)
RDW: 13.9 % (ref 11.5–15.5)
RDW: 14 % (ref 11.5–15.5)
RDW: 14.6 % (ref 11.5–15.5)
WBC: 5.5 10*3/uL (ref 4.0–10.5)
WBC: 3.1 10*3/uL — ABNORMAL LOW (ref 4.0–10.5)
WBC: 3.4 10*3/uL — ABNORMAL LOW (ref 4.0–10.5)
WBC: 4.6 10*3/uL (ref 4.0–10.5)

## 2010-06-16 LAB — COMPREHENSIVE METABOLIC PANEL
BUN: 16 mg/dL (ref 6–23)
CO2: 27 mEq/L (ref 19–32)
Chloride: 113 mEq/L — ABNORMAL HIGH (ref 96–112)
Creatinine, Ser: 1.29 mg/dL (ref 0.4–1.5)
GFR calc non Af Amer: >60 mL/min (ref >60)
Total Bilirubin: 0.7 mg/dL (ref 0.3–1.2)

## 2010-06-16 LAB — BASIC METABOLIC PANEL
BUN: 20 mg/dL (ref 6–23)
Calcium: 9.2 mg/dL (ref 8.4–10.5)
Creatinine, Ser: 1.31 mg/dL (ref 0.4–1.5)
GFR calc Af Amer: >60 mL/min (ref >60)
GFR calc non Af Amer: 57 mL/min — ABNORMAL LOW (ref >60)
GFR calc non Af Amer: >60 mL/min (ref >60)
Glucose, Bld: 104 mg/dL — ABNORMAL HIGH (ref 70–99)
Potassium: 4.7 mEq/L (ref 3.5–5.1)
Sodium: 144 mEq/L (ref 135–145)

## 2010-06-16 LAB — PROTIME-INR
INR: 1.37 (ref 0.00–1.49)
INR: 1.17 (ref 0.00–1.49)
INR: 1.25 (ref 0.00–1.49)
Prothrombin Time: 14.8 seconds (ref 11.6–15.2)
Prothrombin Time: 17.4 seconds — ABNORMAL HIGH (ref 11.6–15.2)

## 2010-06-16 LAB — APTT: aPTT: 35 seconds (ref 24–37)

## 2010-06-16 LAB — VALPROIC ACID LEVEL
Valproic Acid Lvl: 48 ug/mL — ABNORMAL LOW (ref 50.0–100.0)
Valproic Acid Lvl: 74.7 ug/mL (ref 50.0–100.0)

## 2010-06-16 LAB — HEMOGLOBIN A1C: Hgb A1c MFr Bld: 5.5 % (ref 4.6–6.1)

## 2010-06-16 LAB — ETHANOL: Alcohol, Ethyl (B): <5 mg/dL (ref 0–10)

## 2010-06-16 LAB — RAPID URINE DRUG SCREEN, HOSP PERFORMED
Amphetamines: NOT DETECTED
Tetrahydrocannabinol: NOT DETECTED

## 2010-06-17 LAB — POCT I-STAT, CHEM 8
BUN: 16 mg/dL (ref 6–23)
Chloride: 111 mEq/L (ref 96–112)
HCT: 31 % — ABNORMAL LOW (ref 39.0–52.0)
Potassium: 4.2 mEq/L (ref 3.5–5.1)

## 2010-06-17 LAB — CBC
MCHC: 33.6 g/dL (ref 30.0–36.0)
RBC: 3.59 MIL/uL — ABNORMAL LOW (ref 4.22–5.81)
RDW: 15.1 % (ref 11.5–15.5)

## 2010-06-17 LAB — DIFFERENTIAL
Basophils Absolute: 0 10*3/uL (ref 0.0–0.1)
Basophils Relative: 0 % (ref 0–1)
Lymphocytes Relative: 41 % (ref 12–46)
Neutro Abs: 1.9 10*3/uL (ref 1.7–7.7)
Neutrophils Relative %: 47 % (ref 43–77)

## 2010-06-17 LAB — RAPID URINE DRUG SCREEN, HOSP PERFORMED
Amphetamines: NOT DETECTED
Cocaine: NOT DETECTED
Opiates: NOT DETECTED
Tetrahydrocannabinol: NOT DETECTED

## 2010-06-17 LAB — URINALYSIS, ROUTINE W REFLEX MICROSCOPIC
Bilirubin Urine: NEGATIVE
Ketones, ur: NEGATIVE mg/dL
Leukocytes, UA: NEGATIVE
Nitrite: NEGATIVE
Protein, ur: 100 mg/dL — AB
pH: 6.5 (ref 5.0–8.0)

## 2010-06-17 LAB — PROTIME-INR
INR: 2.4 — ABNORMAL HIGH (ref 0.00–1.49)
INR: 2.5 — ABNORMAL HIGH (ref 0.00–1.49)
Prothrombin Time: 26.3 seconds — ABNORMAL HIGH (ref 11.6–15.2)
Prothrombin Time: 26.5 seconds — ABNORMAL HIGH (ref 11.6–15.2)

## 2010-06-17 LAB — ETHANOL: Alcohol, Ethyl (B): <5 mg/dL (ref 0–10)

## 2010-06-18 LAB — PROTIME-INR
INR: 1.8 — ABNORMAL HIGH (ref 0.00–1.49)
INR: 1.9 — ABNORMAL HIGH (ref 0.00–1.49)
INR: 2.2 — ABNORMAL HIGH (ref 0.00–1.49)
INR: 2.3 — ABNORMAL HIGH (ref 0.00–1.49)
Prothrombin Time: 21.1 seconds — ABNORMAL HIGH (ref 11.6–15.2)
Prothrombin Time: 21.4 s — ABNORMAL HIGH (ref 11.6–15.2)
Prothrombin Time: 24.5 s — ABNORMAL HIGH (ref 11.6–15.2)
Prothrombin Time: 25.3 seconds — ABNORMAL HIGH (ref 11.6–15.2)
Prothrombin Time: 32.9 seconds — ABNORMAL HIGH (ref 11.6–15.2)

## 2010-06-18 LAB — APTT
aPTT: 34 s (ref 24–37)
aPTT: 43 seconds — ABNORMAL HIGH (ref 24–37)
aPTT: 48 s — ABNORMAL HIGH (ref 24–37)
aPTT: 59 seconds — ABNORMAL HIGH (ref 24–37)

## 2010-06-18 LAB — CBC
MCHC: 34.1 g/dL (ref 30.0–36.0)
Platelets: 113 10*3/uL — ABNORMAL LOW (ref 150–400)
RDW: 15.9 % — ABNORMAL HIGH (ref 11.5–15.5)

## 2010-06-18 LAB — RAPID URINE DRUG SCREEN, HOSP PERFORMED
Barbiturates: NOT DETECTED
Benzodiazepines: NOT DETECTED
Cocaine: NOT DETECTED
Tetrahydrocannabinol: NOT DETECTED

## 2010-06-18 LAB — COMPREHENSIVE METABOLIC PANEL
ALT: 29 U/L (ref 0–53)
Albumin: 3.7 g/dL (ref 3.5–5.2)
Calcium: 9 mg/dL (ref 8.4–10.5)
GFR calc Af Amer: 51 mL/min — ABNORMAL LOW (ref >60)
Glucose, Bld: 86 mg/dL (ref 70–99)
Sodium: 144 mEq/L (ref 135–145)
Total Protein: 7.1 g/dL (ref 6.0–8.3)

## 2010-06-18 LAB — DIFFERENTIAL
Eosinophils Absolute: 0 10*3/uL (ref 0.0–0.7)
Lymphs Abs: 1.4 10*3/uL (ref 0.7–4.0)
Monocytes Relative: 9 % (ref 3–12)
Neutrophils Relative %: 57 % (ref 43–77)

## 2010-06-18 LAB — VALPROIC ACID LEVEL: Valproic Acid Lvl: 70.1 ug/mL (ref 50.0–100.0)

## 2010-06-19 LAB — PROTIME-INR
INR: 2.2 — ABNORMAL HIGH (ref 0.00–1.49)
INR: 2.3 — ABNORMAL HIGH (ref 0.00–1.49)
Prothrombin Time: 26 seconds — ABNORMAL HIGH (ref 11.6–15.2)
Prothrombin Time: 26.7 seconds — ABNORMAL HIGH (ref 11.6–15.2)

## 2010-06-22 LAB — RAPID URINE DRUG SCREEN, HOSP PERFORMED: Cocaine: NOT DETECTED

## 2010-06-22 LAB — DIFFERENTIAL
Eosinophils Absolute: 0 10*3/uL (ref 0.0–0.7)
Eosinophils Relative: 0 % (ref 0–5)
Lymphocytes Relative: 30 % (ref 12–46)
Lymphs Abs: 1.4 10*3/uL (ref 0.7–4.0)
Monocytes Relative: 12 % (ref 3–12)

## 2010-06-22 LAB — CBC
HCT: 36.6 % — ABNORMAL LOW (ref 39.0–52.0)
Hemoglobin: 12.5 g/dL — ABNORMAL LOW (ref 13.0–17.0)
MCV: 85.8 fL (ref 78.0–100.0)
RBC: 4.27 MIL/uL (ref 4.22–5.81)
WBC: 4.9 10*3/uL (ref 4.0–10.5)

## 2010-06-22 LAB — ETHANOL: Alcohol, Ethyl (B): <5 mg/dL (ref 0–10)

## 2010-06-22 LAB — BASIC METABOLIC PANEL
Chloride: 112 mEq/L (ref 96–112)
GFR calc Af Amer: >60 mL/min (ref >60)
Potassium: 3.7 mEq/L (ref 3.5–5.1)
Sodium: 143 mEq/L (ref 135–145)

## 2010-06-22 LAB — GLUCOSE, CAPILLARY: Glucose-Capillary: 109 mg/dL — ABNORMAL HIGH (ref 70–99)

## 2010-07-26 NOTE — Consult Note (Signed)
NAME:  [name] Larson, [name] Larson                         ACCOUNT NO.:   MEDICAL RECORD NO.:  [id number]           PATIENT TYPE:   LOCATION:                                 FACILITY:   PHYSICIAN:  Antonietta Breach, M.D.  DATE OF BIRTH:  [birth year]   DATE OF CONSULTATION:  11/02/2008  DATE OF DISCHARGE:                                 CONSULTATION   [name] Larson has been noncombative, is cooperative with the bedside care.  He  does not have any hallucinations or delusions.  He does not have  thoughts of harming himself or others.   REVIEW OF SYSTEMS:  NEUROLOGIC:  No stiffness or other extrapyramidal  side effects with his Abilify.   MENTAL STATUS EXAMINATION:  [name] Larson is alert.  His eye contact is good.  He is oriented to all spheres.  His memory function is intact.  Thought  process is logical, coherent, goal-directed.  Thought content, no  thoughts of harming himself or others, no delusions or hallucinations.  His judgment has returned to the level needed for group home placement  and he does have insight into his condition and knows that he needs to  continue his treatment.   ASSESSMENT:  Axis I:  1. Mood disorder, 293.83, not otherwise specified, stable.  2. 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   [name] Larson is not at risk to harm himself or others.  He is now clinically  stable for placement back into his group home.   Would continue his psychotropic medications as on the [facility].   Would ask the social worker to set up an outpatient psychiatric  appointment during the first week of discharge.      Antonietta Breach, M.D.     JW/MEDQ  D:  08/28/2009  T:  08/29/2009  Job:  [id number]

## 2010-07-26 NOTE — Assessment & Plan Note (Signed)
[facility]   NAME:  [name] Larson, [name] Larson                    ACCOUNT NO.:  [id number]   MEDICAL RECORD NO.:  [id number]      DATE OF BIRTH:  1980-12-17   PHYSICIAN:  Leonie Man, M.D.         VISIT DATE:                                   OFFICE VISIT   Wound, right pretibial area, measuring 1.2 x 1.55 x less than 0.1 cm.   HISTORY:  [name] Lanese is a 30 year old man with history of bipolar  disorder.  He lives in a group home in the [facility]  alternatives.  The wound on his leg has been caused by a previous self-  inflicted trauma during his frequent mood swings.   PAST MEDICAL HISTORY:   ALLERGIES:  To LAMICTAL which causes anaphylaxis.   Medications are Abilify, Topamax, Zyprexa, warfarin, Symbicort,  Depakote.  The patient also used a topical cream for a groin rash which  is unknown at this time.   SURGERY:  He had an open reduction and internal fixation of the left  wrist in July of this year 2010, the left arm is currently in a short-  arm cast.   SOCIAL HISTORY:  Single white male who living in a group home.  He is  accompanied to the clinic today by a care giver.  The patient denies  tobacco, alcohol or illicit drug use.  He works in a supervised  facility.   FAMILY HISTORY:  Not obtainable.   REVIEW OF SYSTEMS:  CARDIOPULMONARY:  The patient does have a history of  sleep apnea.  He has asthma.  He has a history of deep vein thrombosis  and currently is on warfarin therapy.  GASTROINTESTINAL:  Negative.  GENITOURINARY:  Negative.  ENDOCRINOLOGIC:  There is no diabetes.  No  thyroid disease.  MUSCULOSKELETAL:  Negative except for his current  problem. NEUROPSYCHIATRIC:  Positive for bipolar disease as noted above.   PHYSICAL EXAMINATION:  VITAL SIGNS:  Temperature 97.7, pulse 65,  respirations 16, blood pressure 113/78.  HEAD AND NECK:  Head is normocephalic.  Pupils round and regular.  No  oropharynx is benign except for a rather poor  dentition.  His neck is  supple.  No thyromegaly or adenopathy.  LUNGS:  Positive for bilateral few bilateral wheezes.  HEART:  Regular rate and rhythm.  No murmurs are heard.  Abdomen is soft, nontender, nondistended.  Normoactive bowel sounds  without any palpable masses or visceromegaly.  EXTREMITIES:  There is an erythematous, edematous skin surrounding a  punched out ulcer with sharp edges and a necrotic base.  There is no  odor or drainage.  Extremity range of motions is good and all within  normal limits.  Pulses are full and palpable bilateral. There is no calf  tenderness on palpation.   Treatment on selective debridement following 2% lidocaine topical  anesthesia.  This was followed by Prisma with hydrogel and a bulky  dressing.  This is to be changed three times weekly.  We will have the  patient come into the clinic for dressing changes as he does not have a  system for dressing changes at his group home.  I will start him  prophylactically on  trimethoprim sulfa.   DISPOSITION:  [facility] nurse  visit.      Leonie Man, M.D.  Electronically Signed     PB/MEDQ  D:  10/12/2008  T:  10/13/2008  Job:  [id number]

## 2010-07-26 NOTE — Op Note (Signed)
NAME:  [name] Larson, [name] Larson                    ACCOUNT NO.:  [id number]   MEDICAL RECORD NO.:  [id number]          PATIENT TYPE:  OBV   LOCATION:  5034                         FACILITY:  [facility]   PHYSICIAN:  Madelynn Done, MD  DATE OF BIRTH:  25-Dec-1980   DATE OF PROCEDURE:  09/25/2008  DATE OF DISCHARGE:                               OPERATIVE REPORT   PREOPERATIVE DIAGNOSES:  1. Left thumb laceration with tendon involvement.  2. Left long finger laceration with tendon involvement.   ATTENDING PHYSICIAN:  Madelynn Done, MD, who scrubbed and present  for the entire procedure.   ASSISTANT SURGEON:  None.   SURGICAL PROCEDURES:  1. Left thumb wound exploration and primary extensor tendon repair,      EPL.  2. Left thumb primary extensor tendon repair, EPB.  3. Repair of laceration of left thumb, complex, traumatic laceration,      6 cm.  4. Left long finger wound exploration and primary extensor tendon      repair, zone 3.  5. Left long finger laceration without tendon involvement, simple      laceration, 3 cm.   ANESTHESIA:  General via LMA.   TOURNIQUET TIME:  Less than 1 hour, 250 mmHg.   SURGICAL INDICATIONS:  [name] Larson is a 30 year old right-hand-dominant  gentleman who put his hand through glass window sustaining a sharp  laceration on the dorsal aspect of his left thumb and left long finger.  The patient was seen and evaluated at emergency department and  recommended to undergo the above procedure.  Risks include but not  limited to bleeding; infection; damage to nearby nerves, arteries, or  tendons; tendon rupture, loss of motion of the thumb and digits; and  need for further surgical intervention.   DESCRIPTION OF PROCEDURE:  The patient was appropriately identified in  preop holding area and a mark with a permanent marker made on the left  thumb and left long finger to indicate the correct operative site.  The  patient was then brought back to the operating room and  placed supine on  the anesthesia room table where general anesthesia was administered via  LMA.  The patient received preoperative antibiotics.  A well-padded  tourniquet was then placed in the left brachium and sealed with a 1000  drape.  The left upper extremity was then prepped sterilely and then  draped.  Time-out was called.  The correct side was identified and  procedure was then begun.  The patient today with 6-cm traumatic  laceration directly over the thumb with the exposed EPL and EPB distal  ends.  The incision was then lengthened proximally.  After careful  dissection, both tendons were then found.  The extensor tendons were  then mobilized.  In order to retrieve the EPB, incision was then made  proximally longitudinally directly over the first dorsal compartment.  The first dorsal compartment was opened and the tendon was found.  Tendon sheath incision was then made in the first dorsal compartment to  expose the tendons.  Following this, attention was  then made to extensor  tendon repair.  Using 6-core strand FiberWire 4-0 suture, the EPL was  then repaired supplemented by 2 horizontal mattress, 4-0 FiberWire  sutures, 8-strand repair.  Following this, the EPB was then repaired  with several figure-of-eight in horizontal mattress, 4-0 FiberWire  sutures.  After extensor tendon repair, the wound was then thoroughly  irrigated.  The wound directly over the first dorsal compartment was  then closed with 4-0 nylon horizontal mattress sutures and one over the  thumb was then closed, repaired, and nicely reapproximated with 4-0  nylon sutures.  Closure of the traumatic laceration measured 6 cm.   Following this, attention was then turned to the long finger.  The  patient had a laceration directly over the MP region.  This laceration  was explored, did not extend down to the extensor mechanism, and the  wound was then thoroughly irrigated and then closed with simple 4-0  nylon  sutures.  The patient did have a laceration more distally directly  over the zone through the extensor mechanism.  This wound was 3 cm,  opened up.  The patient had a partial laceration to the extensor  mechanism, and this was closed with several 4-0 Ethibond simple and  figure-of-eight sutures.  The reapproximation of the extensor mechanism  then carried out.  The wound was then thoroughly irrigated, and the skin  was then closed with 4-0 nylon simple sutures.  10 mL of 0.25% Marcaine  was then infiltrated along the thumb.  Adaptic dressing was then  applied.  A sterile compressive bandage was then applied.  The patient  was then placed into a well-molded thumb spica cast all the way up to  the tips of his digits to immobilize given his mental status.  The  patient was extubated and taken to the recovery room in good condition.   POSTOPERATIVE PLAN:  The patient will be admitted overnight for IV  antibiotics and pain control.  He will be seen back in the office in 2  weeks for wound check and suture removal, back in the cast for a total  of 4 weeks, immobilization, and at that point, consider either  outpatient program versus total of 6 weeks immobilization for the thumb  EPL.  The patient tolerated the procedure well.      Madelynn Done, MD  Electronically Signed     FWO/MEDQ  D:  09/25/2008  T:  09/26/2008  Job:  [phone]

## 2010-07-26 NOTE — Assessment & Plan Note (Signed)
[facility]   NAME:  [name] Larson, [name] Larson                    ACCOUNT NO.:  [id number]   MEDICAL RECORD NO.:  [id number]      DATE OF BIRTH:  1980/10/18   PHYSICIAN:  Leonie Man, M.D.    VISIT DATE:  10/19/2008                                   OFFICE VISIT   PROBLEM:  Right pretibial wound caused by trauma in this 30 year old man  with ongoing psychiatric disorder which caused him to scratch at his leg  whenever he is upset.  Over the past week, he had been coming in for  nurse visits for treatment with Prisma and hydrogel and dry dressings.  He returns today for reevaluation.   On examination, temperature 97.7, pulse 60, respirations 18, blood  pressure 132/91.   The wounds measure 1.5 x 1.7 x 0.1.  He has a clean granulating base;  however, the surrounding skin is still quite purple in hew.  There is no  drainage and there is no odor from this wound.  We will continue him as  follows:  Prisma, hydrogel, and a bulky dressing over the wound.  He was  on trimethoprim and sulfa and was recently started on Cipro 500 mg  b.i.d.   We will continue him on nurse visits in the next x3 days and another  visit again on Monday of next week following which we will see him on a  weekly basis.      Leonie Man, M.D.  Electronically Signed     PB/MEDQ  D:  10/19/2008  T:  10/20/2008  Job:  [id number]

## 2010-07-26 NOTE — Consult Note (Signed)
NAME:  Larson Larson                    ACCOUNT NO.:  [id number]   MEDICAL RECORD NO.:  [id number]          PATIENT TYPE:  EMS   LOCATION:  ED                           FACILITY:  [facility]   PHYSICIAN:  Antonietta Breach, M.D.  DATE OF BIRTH:  1980/10/14   DATE OF CONSULTATION:  10/28/2008  DATE OF DISCHARGE:                                 CONSULTATION   REASON FOR CONSULTATION:  Mood instability.   REQUESTING PHYSICIAN:  [facility].   HISTORY OF PRESENT ILLNESS:  Mr. Larson Larson is a 30 year old male  presenting to the [facility] on October 26, 2008 for mood  instability and a hand wound.   Mr. Larson Larson stated that he was going to kill himself.  He was brought in by  his caregiver.  The patient became very angry and agitated at the group  home.  He tried to harm group home staff.   The precipitating event seemed to be that the patient was not being able  to use the phone.   Just recently, Mr. Larson Larson cut his left hand and sustained a laceration by  hitting a window at the group home.  He has been stating that he wants  to kill himself by cutting his wrists.  His mood has been unstable with  mood swings.  His condition had developed despite being on a number of  psychotropic medications.   PAST PSYCHIATRIC HISTORY:  Mr. Larson Larson has a long-term history of mood  instability.  His recent psychotropic medications have included Abilify  30 mg daily, Topamax 50 mg b.i.d., Zyprexa 40 mg daily, Depakote 1000 mg  daily.   FAMILY PSYCHIATRIC HISTORY:  None known.   SOCIAL HISTORY:  Mr. Larson Larson is single.  He is medically disabled from his  mental condition and lives in a group home.  He does not use alcohol or  illegal drugs.  He enjoys watching pro sports on TV.   PAST MEDICAL HISTORY:  1. Klinefelter syndrome.  2. History of blood clot in his lower leg.   ALLERGIES:  LAMICTAL.   LABORATORY DATA:  INR 2.2.  Alcohol level negative.  Sodium 144, BUN 23,  creatinine 1.91, SGOT 35,  SGPT 29.   WBC 4.2, hemoglobin 12.6, platelet count 113.  Urine drug screen  unremarkable.   REVIEW OF SYSTEMS:  Constitutional, head, eyes, ears, nose, throat,  mouth, neurologic, psychiatric, cardiovascular, respiratory,  gastrointestinal, genitourinary, skin, musculoskeletal, hematologic,  lymphatic, endocrine, metabolic unremarkable.   PHYSICAL EXAMINATION:  VITAL SIGNS:  Temperature 98.3, pulse 58,  respiratory rate 16, blood pressure 112/75, O2 saturation 99% on room  air.  GENERAL APPEARANCE:  Mr. Larson Larson is a young male appearing his chronologic  age, sitting up on his hospital gurney with no abnormal involuntary  movements.  MENTAL STATUS EXAM:  Mr. Larson Larson is alert.  His eye contact is intermittent.  His attention span is mildly decreased.  His affect is labile with  inappropriate laughter.  His mood reflects his affect.  His  concentration is mildly decreased.  He is oriented to all spheres.  His  memory is intact to immediate recent and remote.  Fund of knowledge and  intelligence are below average.  His speech is mildly loud.  There is  slight dysarthria.  Thought process involves some illogia.  Thought  content - please see the history of present illness.  Insight is poor.  Judgment is impaired.   ASSESSMENT:  AXIS I:  293.83 - mood disorder not otherwise specified.  AXIS II:  Deferred.  AXIS III:  See past medical history.  AXIS IV:  Primary support group.  AXIS V:  30.   Mr. Larson Larson has been demonstrating over several weeks that he has an  exacerbation of his mood condition.  He is engaged in destructive self  behavior and has been threatening others.  He has not responded to his  recent outpatient psychiatric care.  It may be that he is not taking his  medicines correctly.   RECOMMENDATIONS:  1. Would admit to an inpatient psychiatric unit for further evaluation      and treatment.  2. Would continue with his current psychotropic agents.  Given his      mild reduction  in platelets, would not increase his Depakote      further and would recheck his platelet count during the first week      to confirm that it is stable, noting that Depakote can suppress      platelets.      Antonietta Breach, M.D.  Electronically Signed     JW/MEDQ  D:  11/01/2008  T:  11/01/2008  Job:  [id number]

## 2010-07-29 NOTE — Discharge Summary (Signed)
NAME:  [name] Larson, [name] Larson                                ACCOUNT NO.:  [id number]   MEDICAL RECORD NO.:  [id number]                   PATIENT TYPE:  INP   LOCATION:  3028                                 FACILITY:  [facility]   PHYSICIAN:  Deirdre Peer. Polite, M.D.              DATE OF BIRTH:  August 31, 1980   DATE OF ADMISSION:  10/06/2002  DATE OF DISCHARGE:  10/10/2002                                 DISCHARGE SUMMARY   DISCHARGE DIAGNOSES:  1. Schizophrenia.  2. Suicide attempt.  3. History of __________.  4. Mental retardation.  5. Questionable history of weight loss, per mother.  6. Old records not received from past evaluation.   DISCHARGE MEDICATIONS:  1. Lithium 300 mg b.i.d.  2. Zyprexa 15 mg q.h.s.  3. Abilify 20 mg q.h.s.  4. Tylenol 650 p.r.n.  5. Haldol p.r.n.   CONSULTANTS:  Dr. Jeanie Sewer of Psychiatry, who recommended inpatient  treatment because of patient's impaired judgement and inability to care for  himself.   LABORATORY STUDIES:  Patient had a urine drug screen, which was negative.  BMET within normal limits.  Lithium level of 0.64.  EKG:  First-degree AV  block.  Drug screen is negative.  Alcohol level less than 5.   HISTORY OF PRESENT ILLNESS:  A 30 year old male presented to the ED with  attempted suicide by slashing his wrists with very superficial resultant  injuries.  Patient has been in a group home and, over the last couple of  weeks, has had increased violence to himself with frequent suicidal threats  and threats towards staff.  Admission was deemed necessary for further  evaluation and treatment.  Please see H&P for further details of history and  present illness.   PAST MEDICAL HISTORY:  As stated above.   MEDICATIONS:  As stated in med section.   SOCIAL HISTORY:  Negative for tobacco, alcohol or drugs.  Patient lives in a  group home.   PHYSICAL EXAMINATION:  VITAL SIGNS:  On admission, patient was afebrile.  GENERAL:  Hemodynamically stable.  HEENT:   Within normal limits.  CHEST:  Clear.  There is positive gynecomastia.  CARDIOVASCULAR:  Regular.  ABDOMEN:  Nontender.  EXTREMITIES:  No edema.   HOSPITAL COURSE:  Patient was admitted to a medicine floor bed for  evaluation and treatment of suicide attempt.  Patient was seen in  consultation by psychiatry.  It was deemed that the patient was  inappropriate to return to the group home setting and needed inpatient  treatment for stability.   The patient's hospitalization was essentially asymptomatic.  He was noted to  have first-degree AV block, but, again, as stated, asymptomatic.   Patient's mother was informed of patient's progress and had comments about  the patient's past history of weight loss.  Old records were requested for  prior evaluation.  It was recommended old records be reviewed  and have  further evaluation as deemed appropriate by the patient's primary MD.   At this time, the patient is emotionally labile and requires inpatient  psychiatric treatment.                                                Deirdre Peer. Polite, M.D.    RDP/MEDQ  D:  10/10/2002  T:  10/10/2002  Job:  [id number]

## 2010-07-29 NOTE — Procedures (Signed)
NAME:  [place], [place]                    ACCOUNT NO.:  [id number]   MEDICAL RECORD NO.:  [id number]          PATIENT TYPE:  OUT   LOCATION:  SLEEP CENTER                 FACILITY:  [facility]   PHYSICIAN:  Coralyn Helling, M.D.      DATE OF BIRTH:  1981/01/29   DATE OF STUDY:  06/05/2005                              NOCTURNAL POLYSOMNOGRAM   INDICATION FOR STUDY:  This is an individual who had undergone an overnight  polysomnogram on February 28, 2005, which showed an apnea-hypopnea index  of  10 with an oxygen saturation of 81%.  Also of note is that he had an episode  of enuresis during this study.  He was referred back to the sleep lab for a  CPAP titration study.   EPWORTH SLEEPINESS SCORE:  14.   MEDICATIONS:  Abilify, lithium, Zyprexa, Lamictal, Topamax, and Singulair.   SLEEP ARCHITECTURE:  Total recording time was 397 minutes.  Total sleep time  was 395 minutes.  Sleep proficiency was 99%.  The patient was observed in  all stages of sleep, although there was a relative reduction in the  percentage of REM sleep at 14%.  Sleep latency was 1.5 minutes, which is  significantly reduced.  REM latency was 160 minutes, which was significantly  prolonged.  The patient was observed in both the supine and non-supine  positions.   RESPIRATORY DATA:  The patient was titrated from a CPAP pressure reading of  5-9 cm of water and a pressure setting of 6 cm of water.  The apnea hypopnea  index was 0-3.7.  Snoring was reduced.  At high pressure settings, the  patient was noted to have occasional central apneic events.   OXYGEN DATA:  At a CPAP reading of 6 cm of water, the mean oxygen saturation  during non-REM was 92.8 and during REM was 93.4.  In the middle, oxygen  saturation was 88% during non-REM and 87 during REM.   CARDIAC DATA:  Normal sinus rhythm.   MOVEMENT-PARASOMNIA:  The patient was noted to have an episode of movement  during REM associated with what the technician described as a  scream.  On  review of the EEG, this appears to be around the time when the patient had a  respiratory event and an arousal, but there were no further abnormal  behaviors observed during the remainder of the study.  Again, these were  noted on epochs 395 and 396.  The periodic limb movement index was 3.2.   IMPRESSIONS-RECOMMENDATIONS:  At a CPAP pressure setting of 6 cm of water,  the apnea-hypopnea index was reduced to 3.7.  Oxygenation was stabilized.  Sleep architecture was stabilized.  The patient was observed in REM sleep  and supine sleep at this pressure setting.  The patient should be started on  CPAP at a pressure setting of 6 cm of water and followed up for his  tolerance complaints to CPAP therapy.  Also of note is the above-stated  behaviors noted during epochs 395 and 396, and clinical correlation would be  necessary to determine the significance of these events.  Coralyn Helling, M.D.  Diplomat, Biomedical engineer of Sleep Medicine  Electronically Signed     VS/MEDQ  D:  06/26/2005 18:41:44  T:  06/27/2005 12:59:24  Job:  [id number]

## 2010-07-29 NOTE — Procedures (Signed)
NAME:  [name] Larson, [name] Larson                    ACCOUNT NO.:  [id number]   MEDICAL RECORD NO.:  [id number]          PATIENT TYPE:  OUT   LOCATION:  SLEEP CENTER                 FACILITY:  [facility]   PHYSICIAN:  Coralyn Helling, M.D.      DATE OF BIRTH:  Dec 10, 1980   DATE OF STUDY:  02/28/2005                              NOCTURNAL POLYSOMNOGRAM   INDICATION FOR STUDY:  History of snoring in an individual with obesity and  symptoms of excessive daytime sleepiness.   EPWORTH SLEEPINESS SCORE:  11   MEDICATIONS:  Lamisil, lithium, Topamax, and Seroquel.   SLEEP ARCHITECTURE:  Total recording time was 360.5 minutes.  Total sleep  time was 315.5 minutes.  Sleep efficiency was 88%.  The patient was observed  in all stages of sleep.  Of note is that he had 109 minutes of slow wave  sleep which was 35% of the total sleep time which is slightly prolonged and  he had 39.5 minutes of REM sleep which was 13% of the total sleep time which  was mildly reduced.  Sleep latency was 7.5 minutes, REM latency was 88  minutes.  The patient was observed in the supine position for the entire  study.   RESPIRATORY DATA:  The apnea-hypopnea index was 10 and loud snoring was  noted by the technician.  Of note is that the patient had quite significant  truncation of his airflow waveform on the polysomnogram report.   OXYGEN DATA:  The lowest oxygenation was 81%.  He spent a total of 59  minutes of sleep time with an oxygen saturation between 81 and 90%.   CARDIAC DATA:  EKG showed normal sinus rhythm.   MOVEMENT-PARASOMNIA:  The periodic limb movement index was 3.2.  Also of  note is that the patient had an episode of enuresis.   IMPRESSIONS-RECOMMENDATIONS:  This study shows evidence for mild to moderate  obstructive sleep apnea as demonstrated by an apnea-hypopnea index of 10  with an oxygen saturation nadir of 81%.  Also of note is that the patient  had an episode of enuresis during the study and this can be seen with  untreated obstructive sleep apnea.  The patient should be counseled with  regards to the importance of diet, exercise, and weight reduction.  He  should also be consulted with regard to the importance of avoidance of  alcohol and sedatives.  Treatment options should be discussed with the  patient including continuous positive airway pressure therapy, oral  appliance and surgical  procedures.  Of note is that the patient has undergone uvulopharyngoplasty.  At this point strong consideration should be made for having the patient be  referred for a continuous positive airway pressure titration study.      Coralyn Helling, M.D.  Diplomat, Biomedical engineer of Sleep Medicine  Electronically Signed     VS/MEDQ  D:  03/08/2005 15:10:34  T:  03/08/2005 19:47:50  Job:  [id number]

## 2010-12-01 LAB — PROTIME-INR
INR: 2.7 — ABNORMAL HIGH
Prothrombin Time: 29.3 — ABNORMAL HIGH

## 2010-12-05 LAB — POCT URINALYSIS DIP (DEVICE)
Bilirubin Urine: NEGATIVE
Glucose, UA: NEGATIVE
Nitrite: NEGATIVE
Urobilinogen, UA: 0.2
pH: 6

## 2011-08-25 IMAGING — CR DG CHEST 2V
4 series · 4 of 4 positions shown · non-contrast
Comparison: 08/16/2009

CLINICAL DATA: Weakness.

CHEST - 2 VIEW

[w chest lat (1 of 2)]
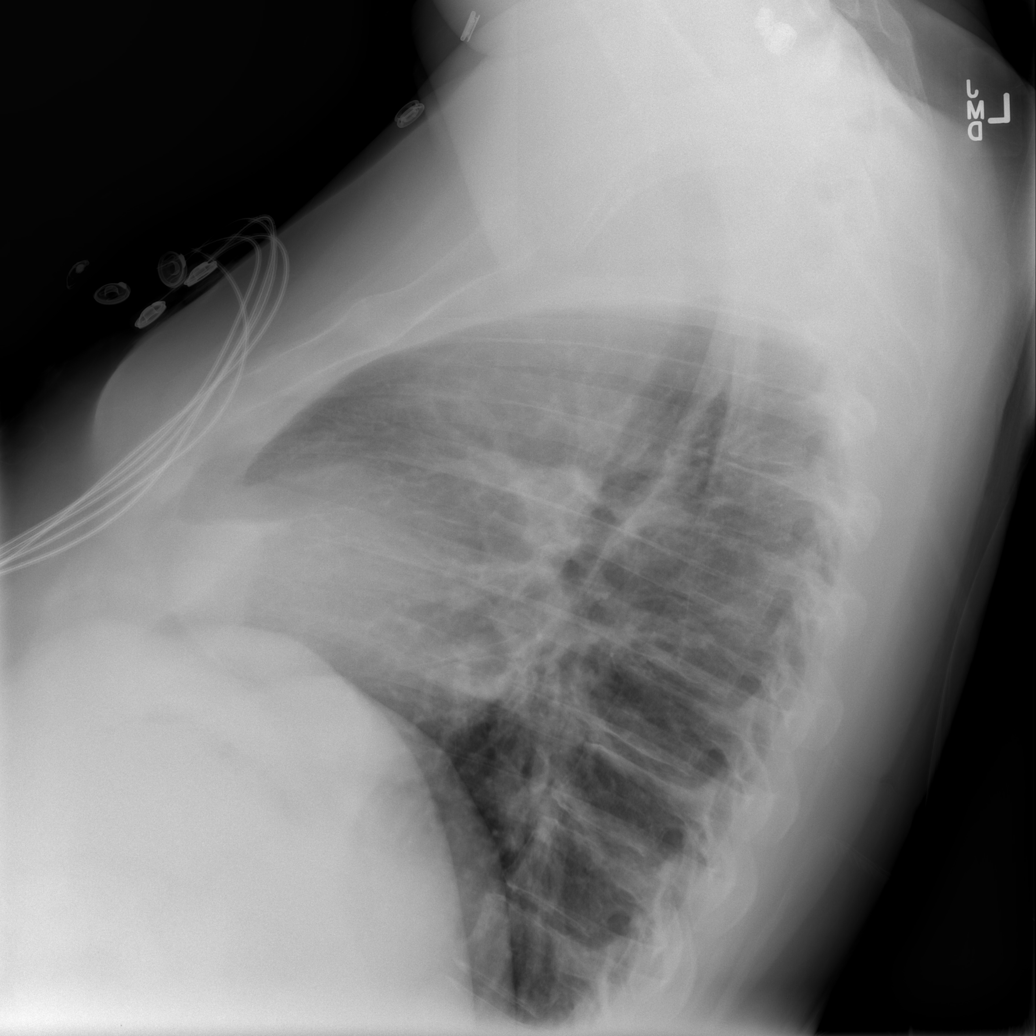

[w chest lat (2 of 2)]
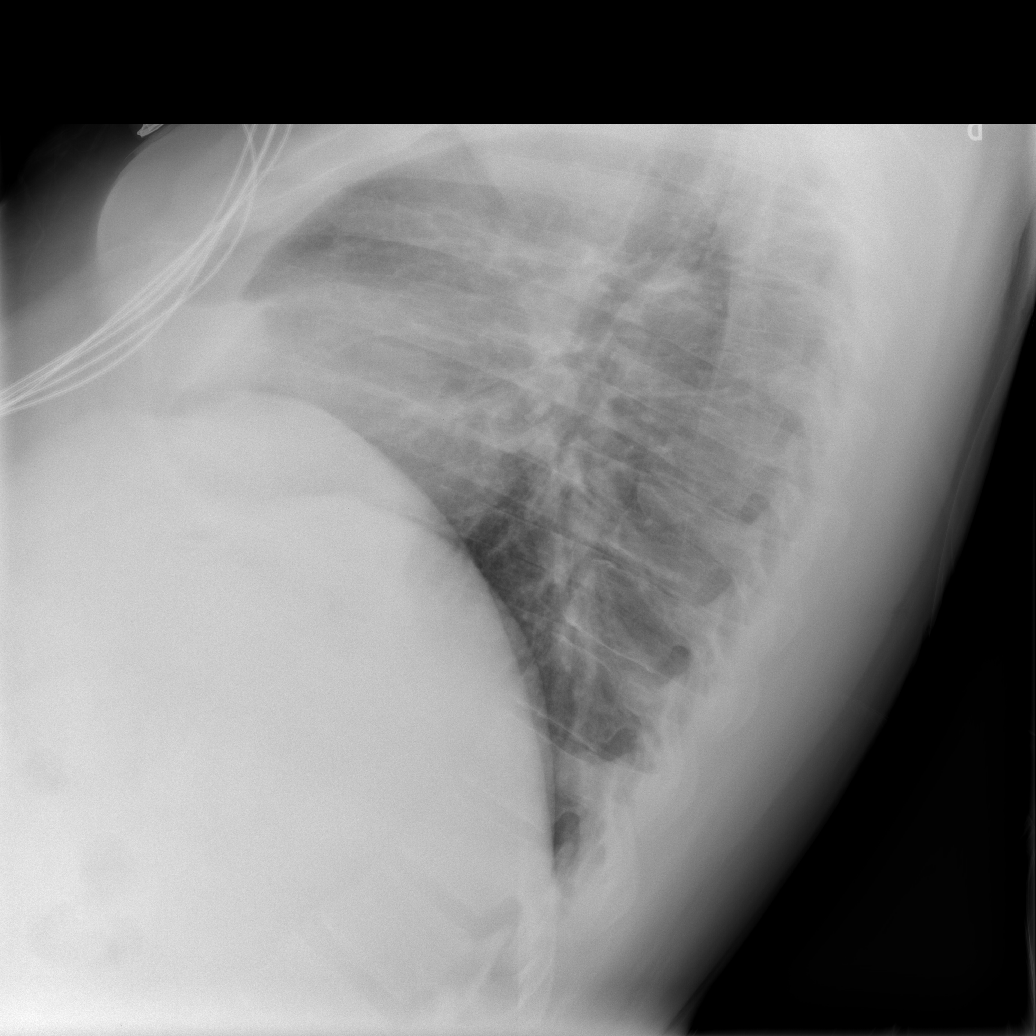

[view not recorded (1 of 2)]
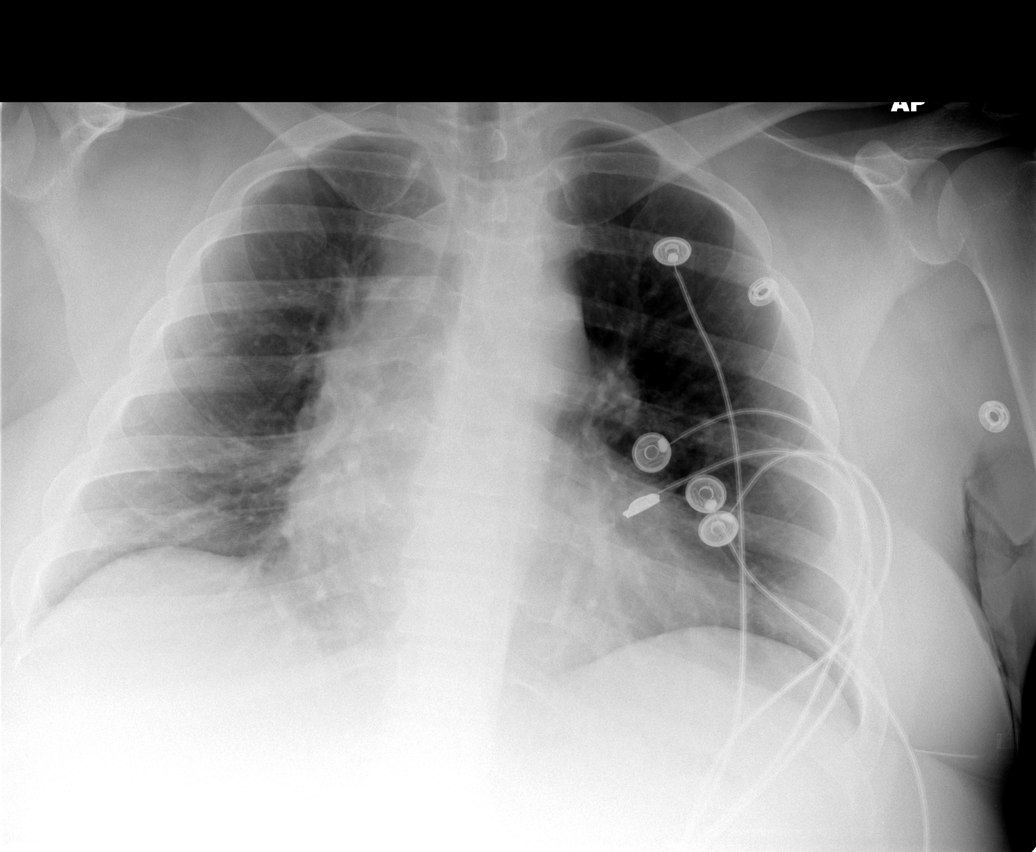

[view not recorded (2 of 2)]
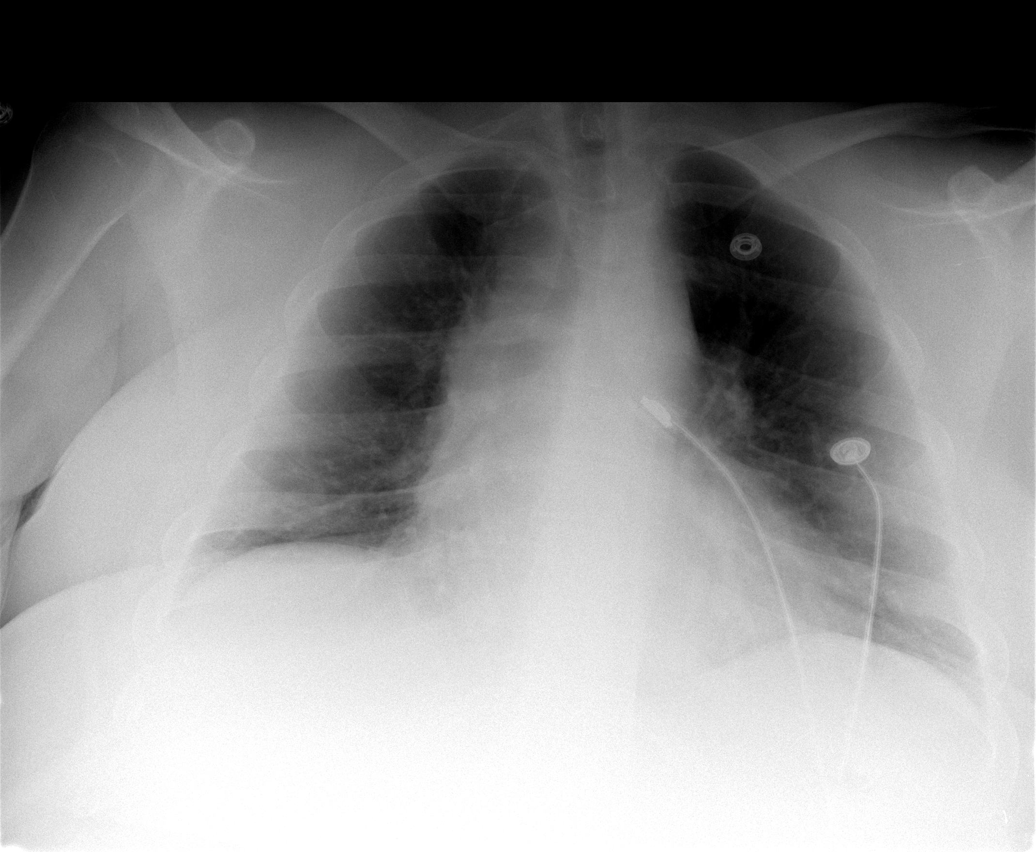

[4 of 4 positions shown; findings below may reference images not displayed]

FINDINGS: There is cardiomegaly.  Lungs are clear.  No effusions or
acute bony abnormality.  No overt edema.
IMPRESSION: Cardiomegaly.  No acute findings.

## 2011-08-25 IMAGING — CT CT HEAD W/O CM
1 series · 16 of 30 positions shown, 20 images · non-contrast
Comparison: None

CLINICAL DATA: Weakness.

CT HEAD WITHOUT CONTRAST
TECHNIQUE: Contiguous axial images were obtained from the base of
the skull through the vertex without contrast.

[Series 2: headseq 4.8 h45s · axial · 0.43mm/px · z∈[-168,-16]mm · 16 of 36 slices shown, 20 images]
[im 2/36  brain]
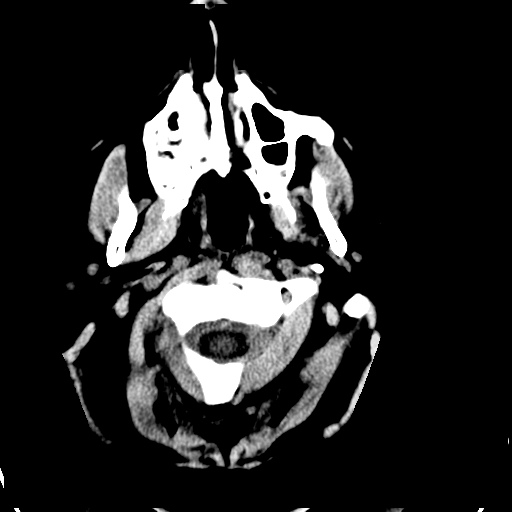
[im 2/36  bone]
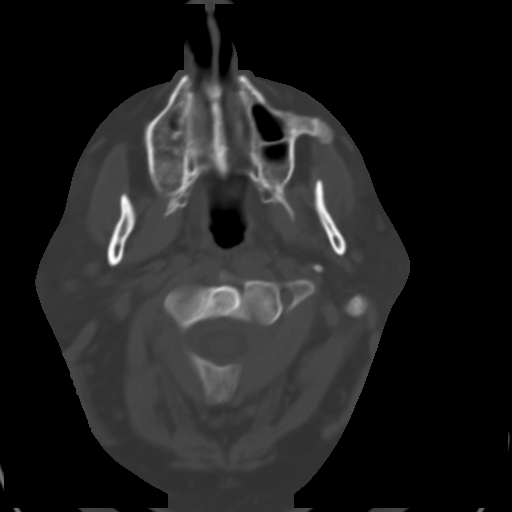
[im 4/36  brain]
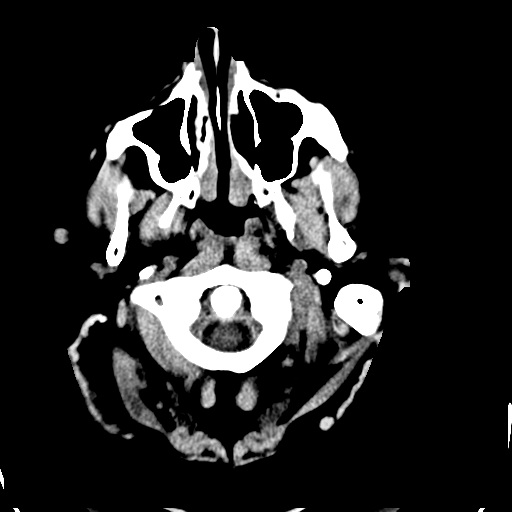
[im 7/36  brain]
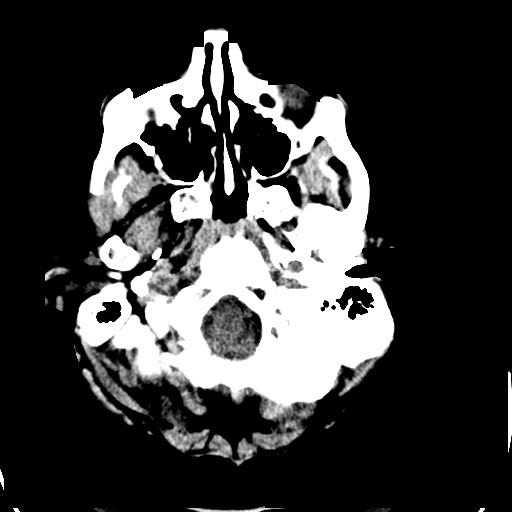
[im 9/36  brain]
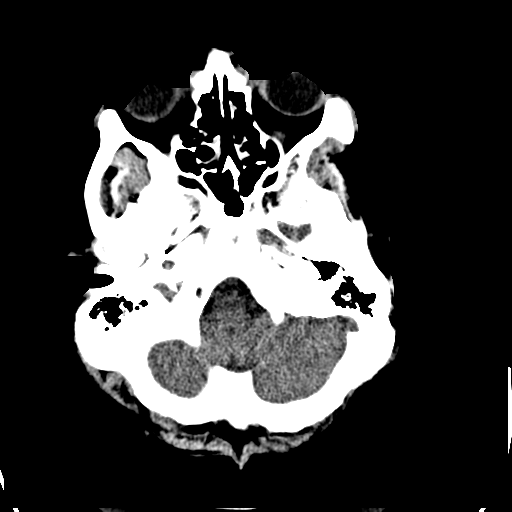
[im 10/36  brain]
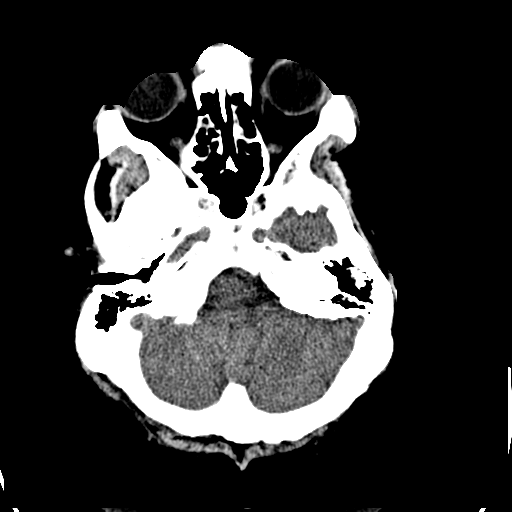
[im 10/36  bone]
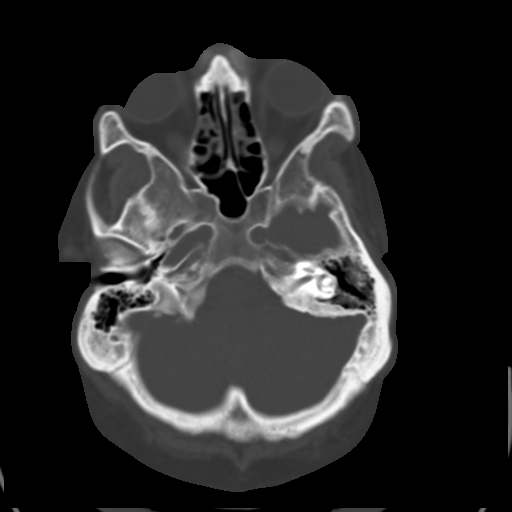
[im 13/36  brain]
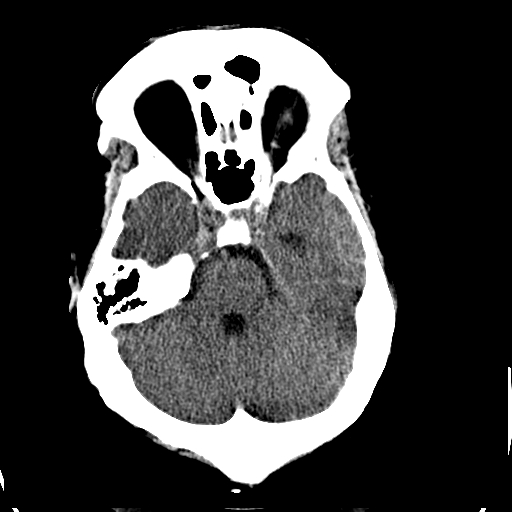
[im 15/36  brain]
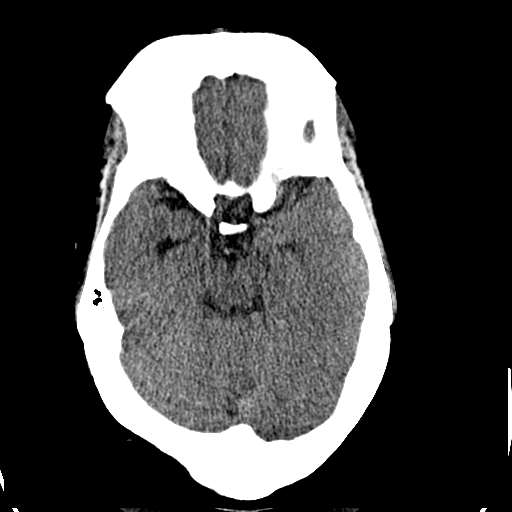
[im 17/36  brain]
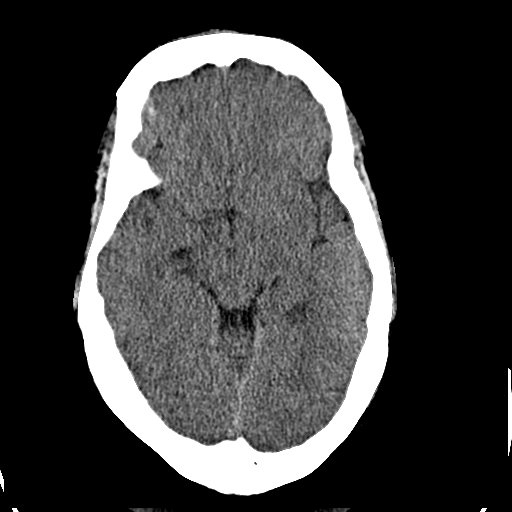
[im 19/36  brain]
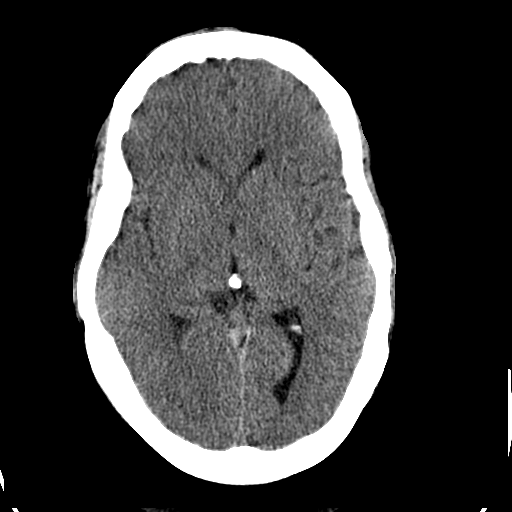
[im 19/36  bone]
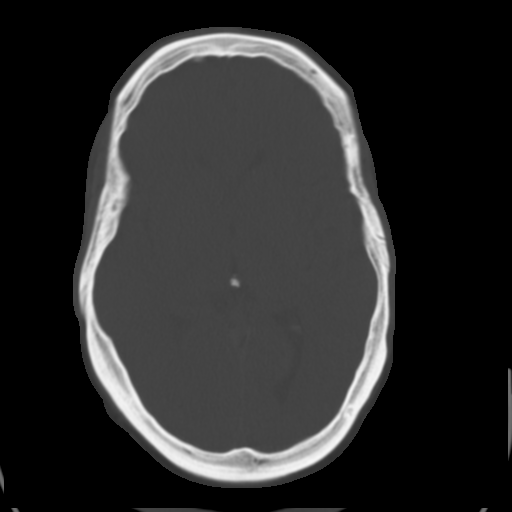
[im 21/36  brain]
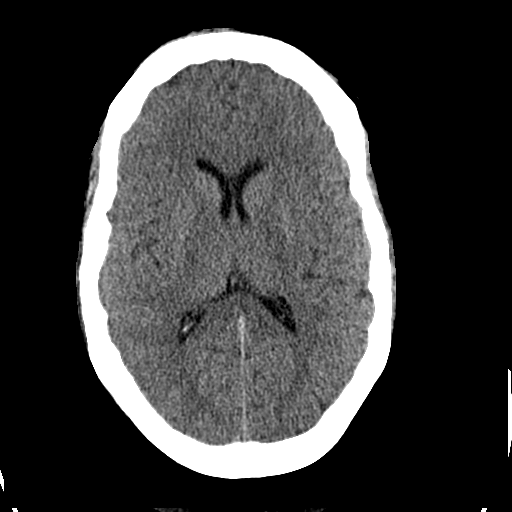
[im 23/36  brain]
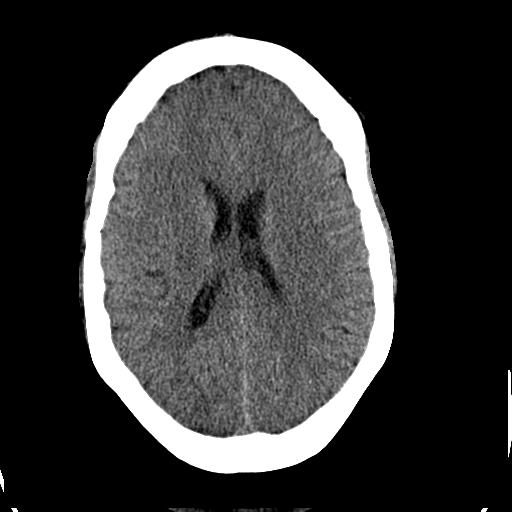
[im 26/36  brain]
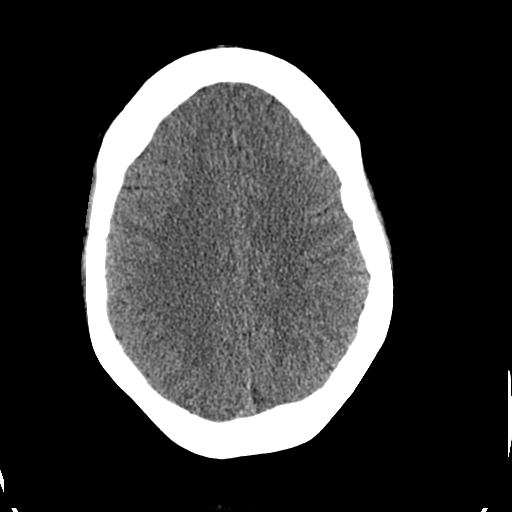
[im 27/36  brain]
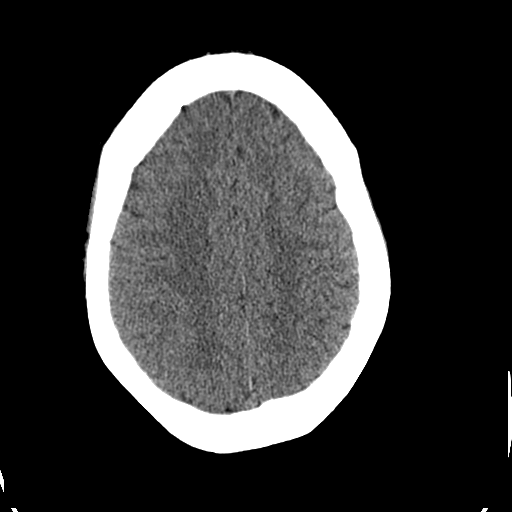
[im 27/36  bone]
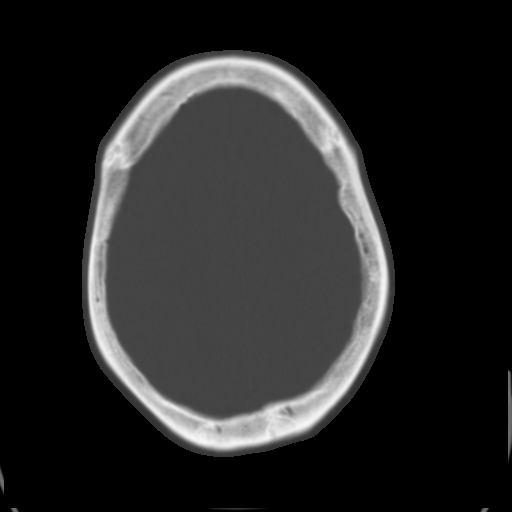
[im 29/36  brain]
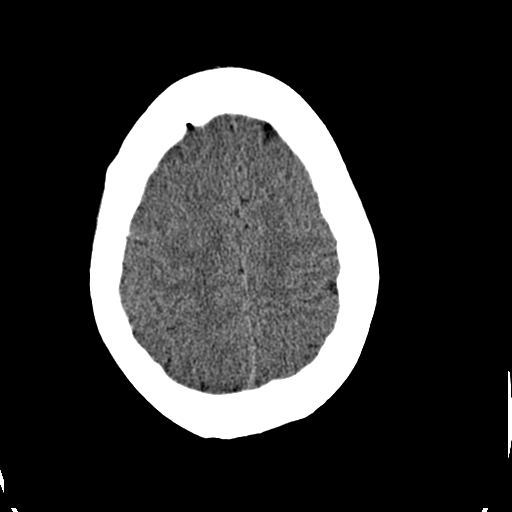
[im 32/36  brain]
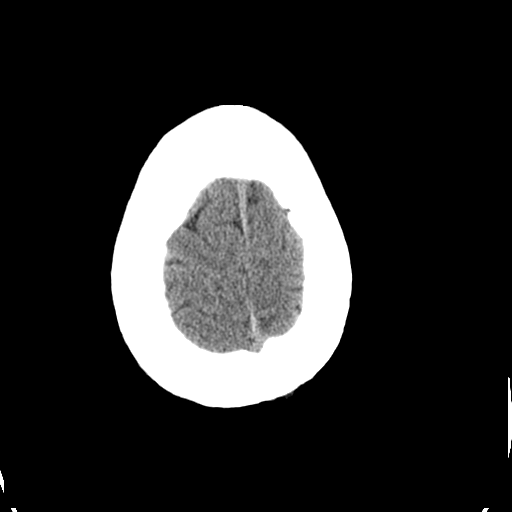
[im 34/36  brain]
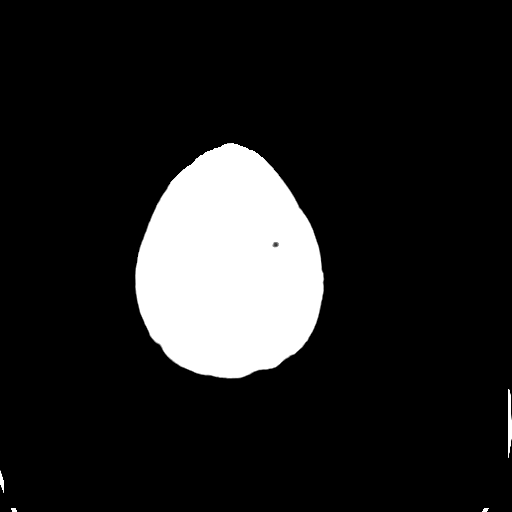

[16 of 30 positions shown; findings below may reference images not displayed]

FINDINGS: No acute intracranial abnormality.  Specifically, no
hemorrhage, hydrocephalus, mass lesion, acute infarction, or
significant intracranial injury.  No acute calvarial abnormality.
Minimal disease in the ethmoid air cells.  Mastoids are clear.
Orbital soft tissues unremarkable.
IMPRESSION: No acute intracranial abnormality.

Chronic sinusitis.

## 2011-08-30 IMAGING — CR DG CHEST 2V
2 series · 2 of 2 positions shown · non-contrast
Comparison: 10/31/2009

CLINICAL DATA: Cough, neutropenia

CHEST - 2 VIEW

[w chest pa]
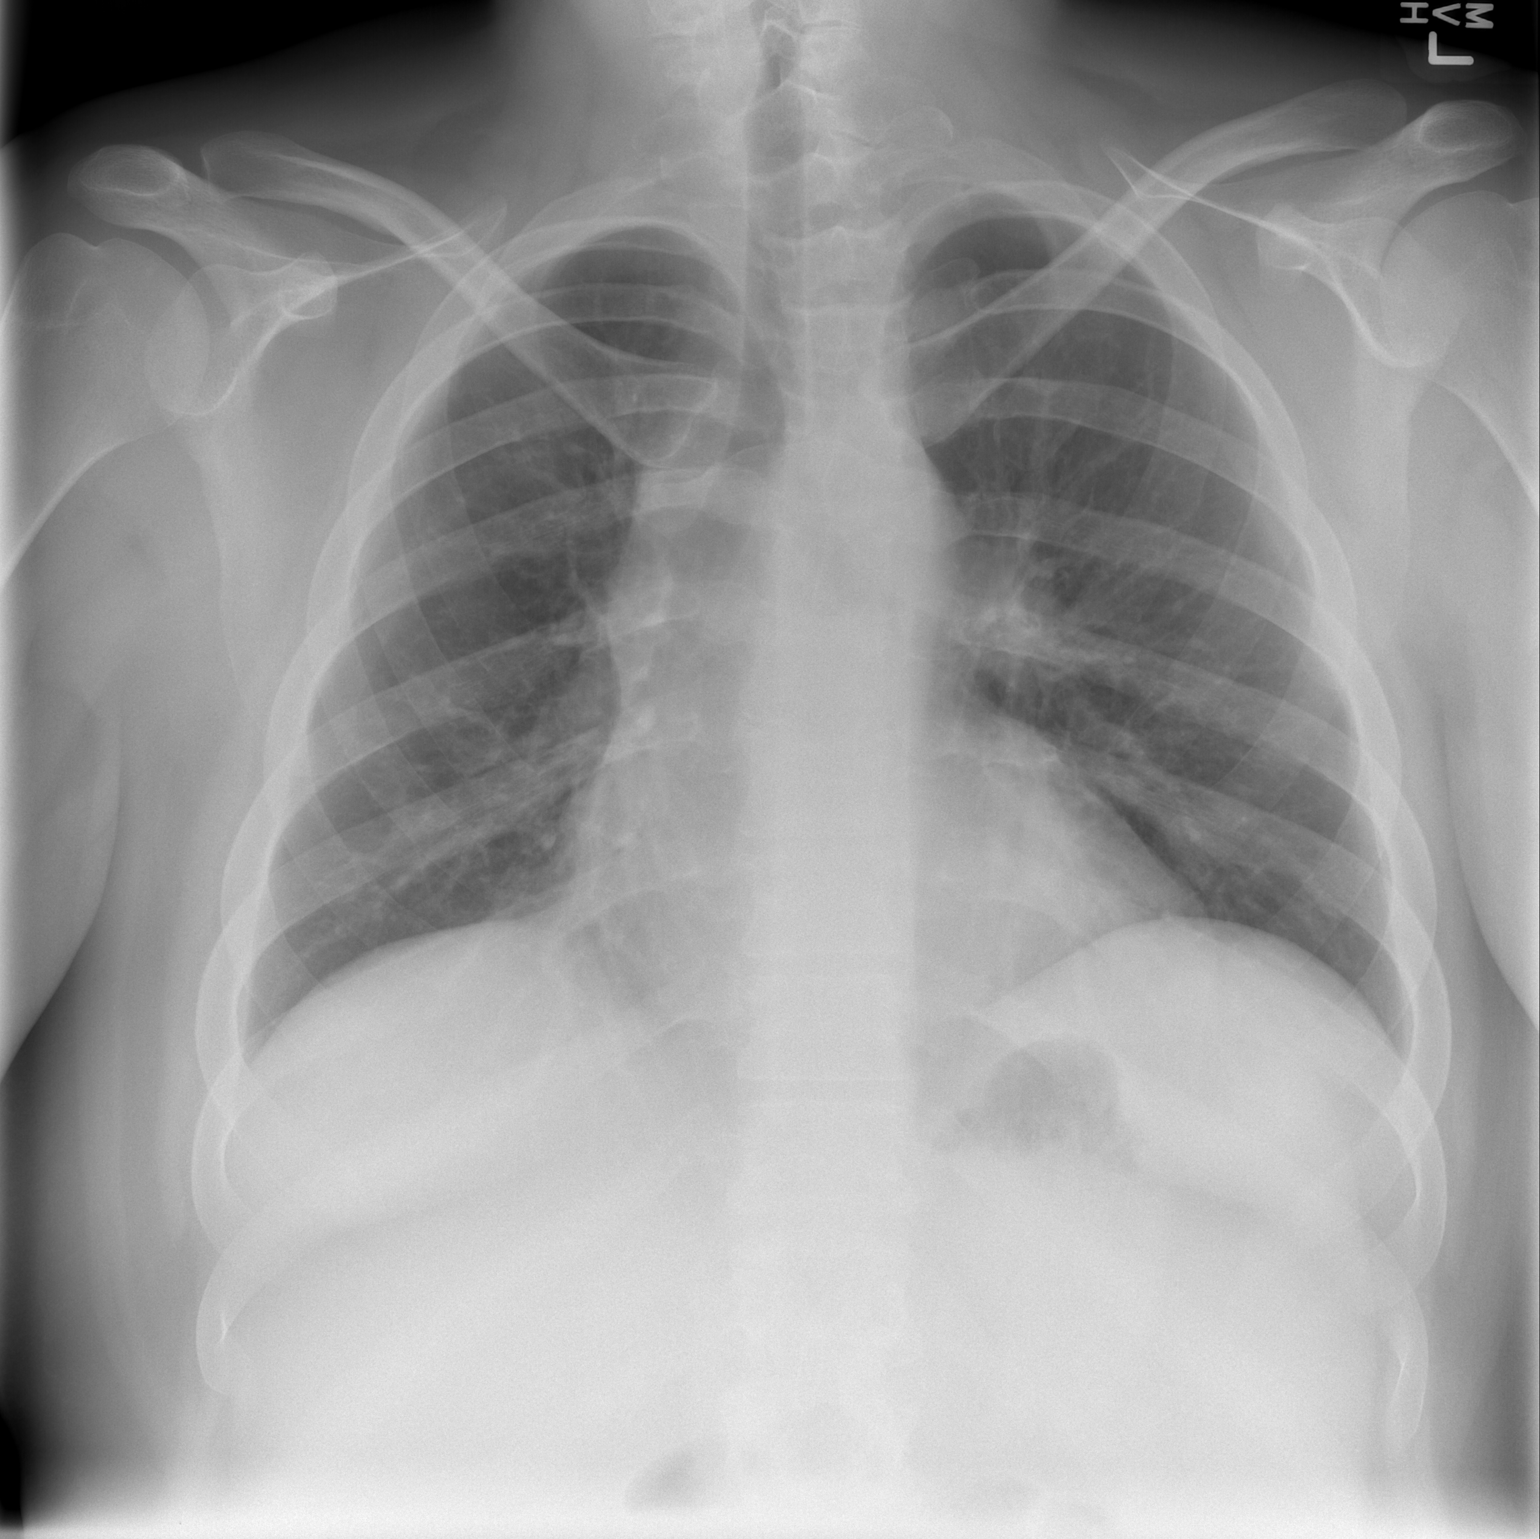

[w chest lat]
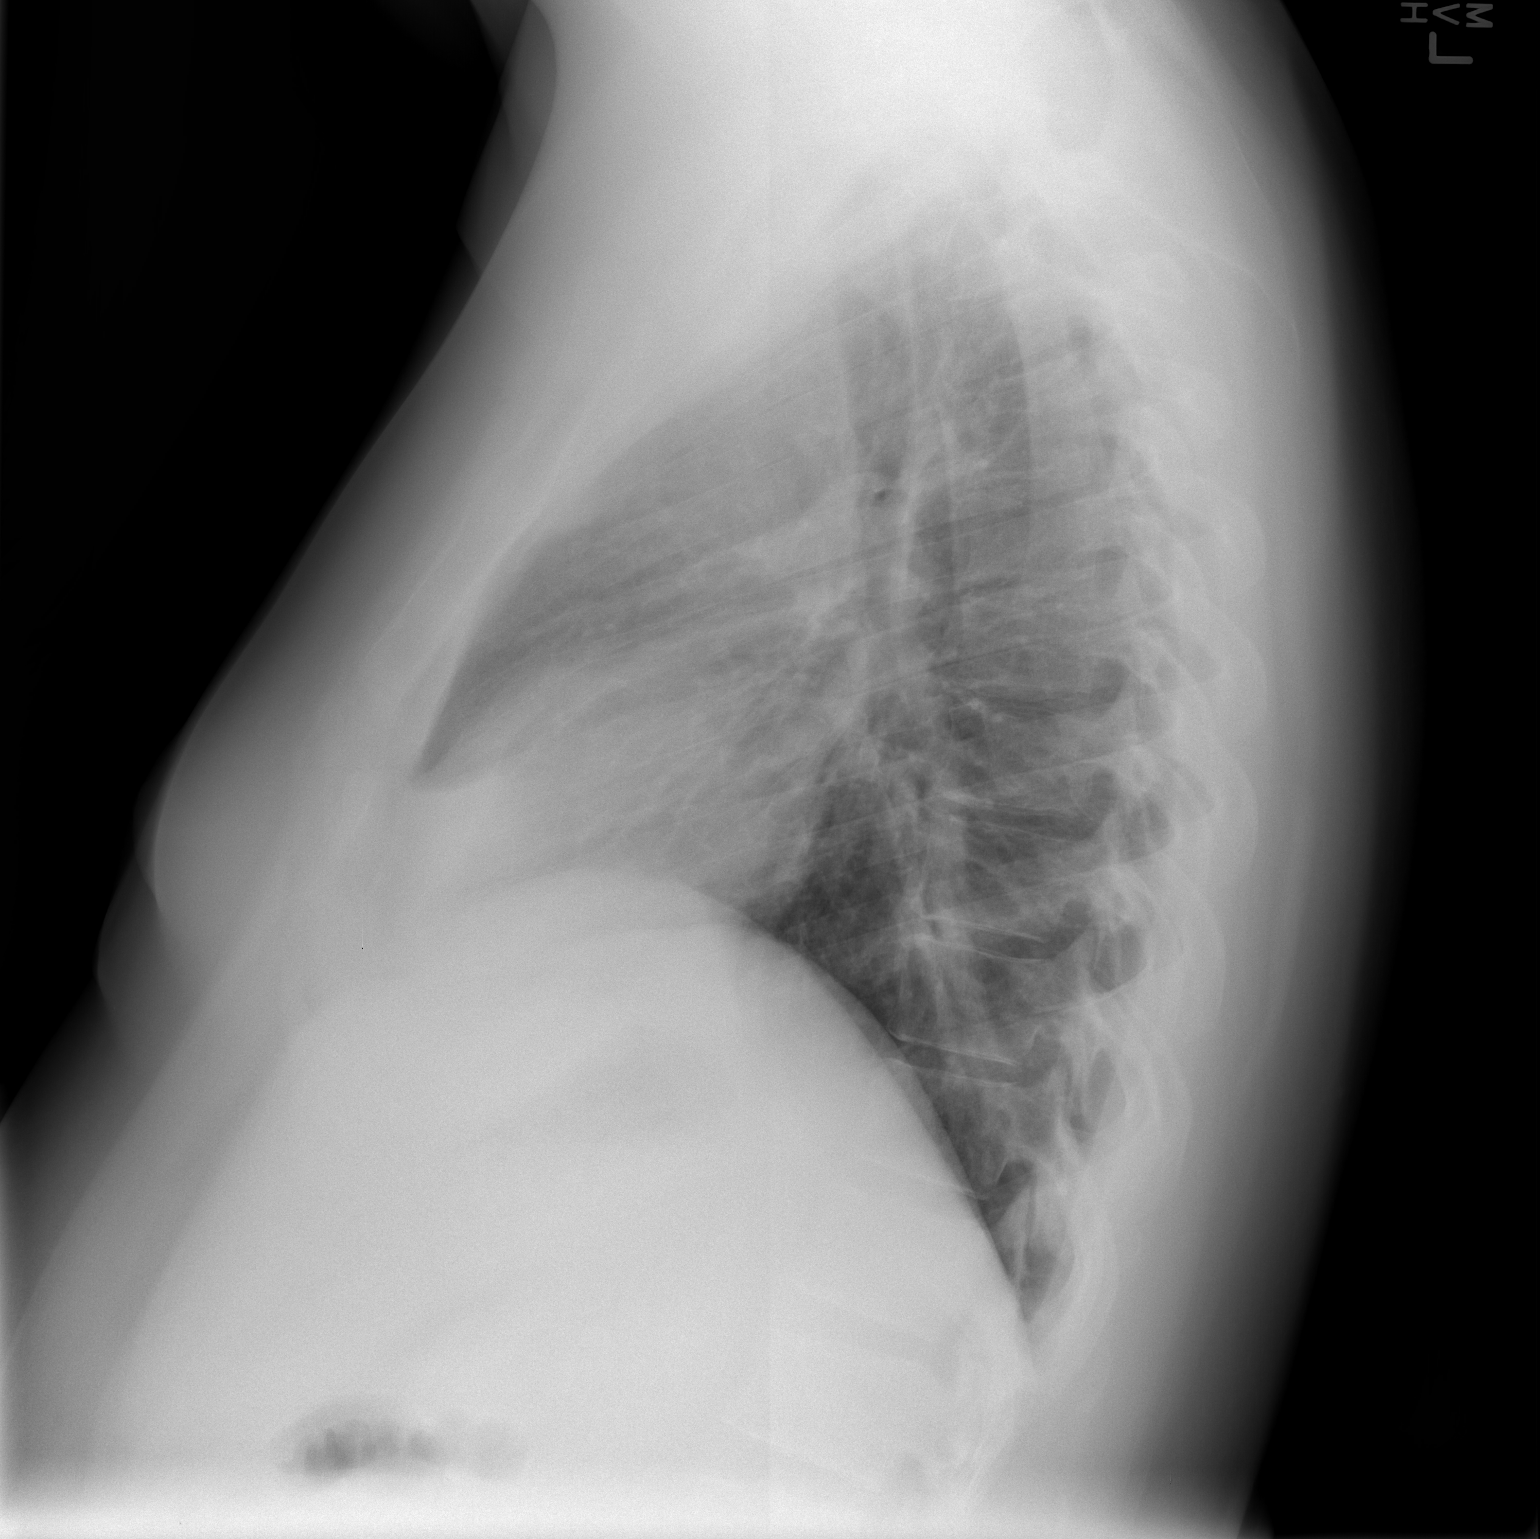

[2 of 2 positions shown; findings below may reference images not displayed]

FINDINGS: Heart size is normal and the vascularity is normal.
There is no heart failure.  The lungs are clear without infiltrate
or effusion.  There is no mass or pulmonary nodule.
IMPRESSION: No active cardiopulmonary disease.

## 2011-09-02 IMAGING — US US RENAL
1 series · 14 of 25 positions shown · non-contrast
Comparison: Renal ultrasound 02/28/2006.

Addendum Begins

This addendum is given for the purpose of correcting the second
impression on the initially dictated report.  The second conclusion
should read,  "increased cortical echogenicity compatible with
medical renal disease."
Addendum Ends
CLINICAL DATA: Acute renal failure.
RENAL/URINARY TRACT ULTRASOUND COMPLETE

[Series 1: us renal · 0.30mm/px · 14 of 48 slices shown]
[im 1/48]
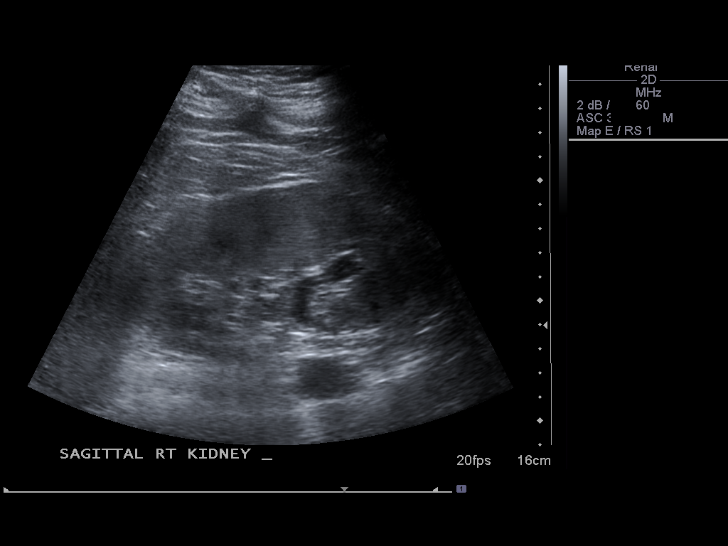
[im 4/48]
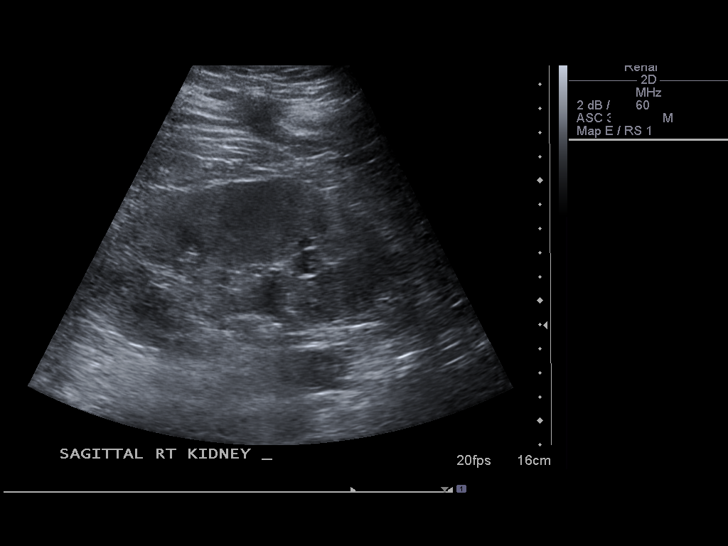
[im 8/48]
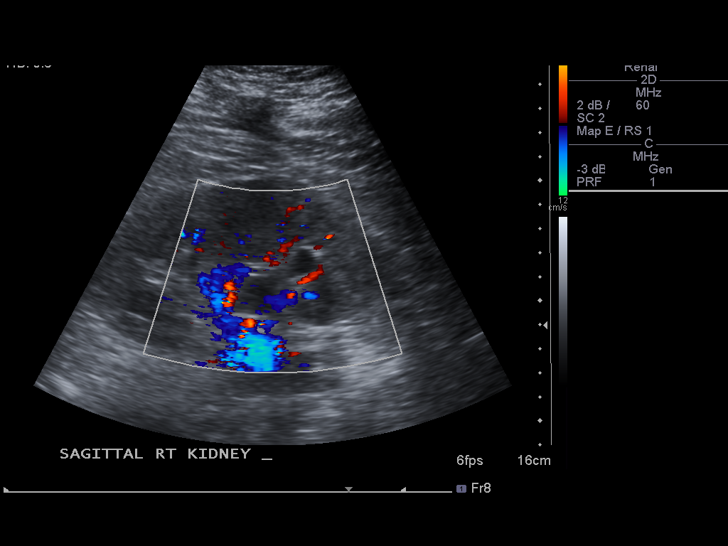
[im 12/48]
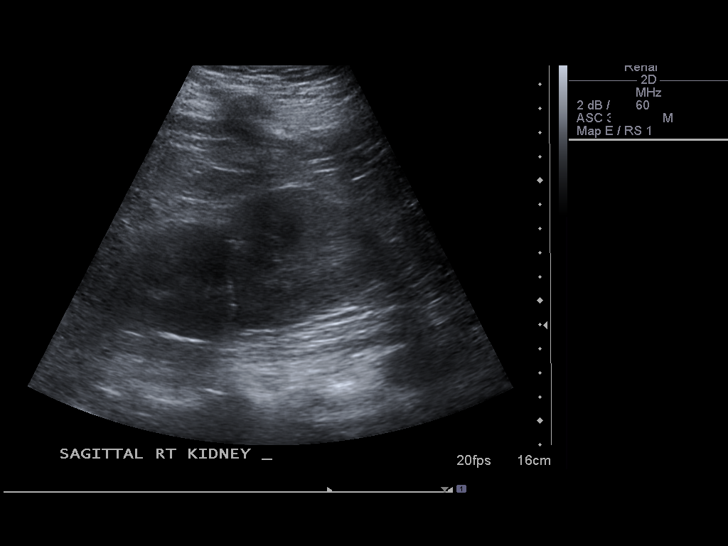
[im 16/48]
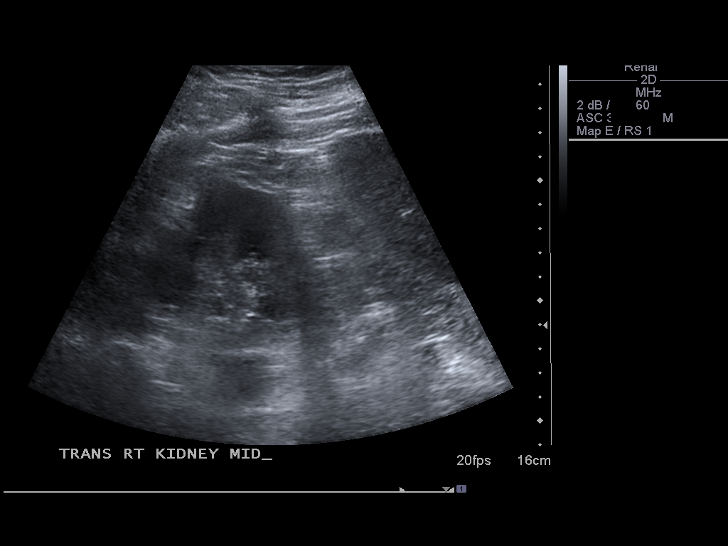
[im 18/48]
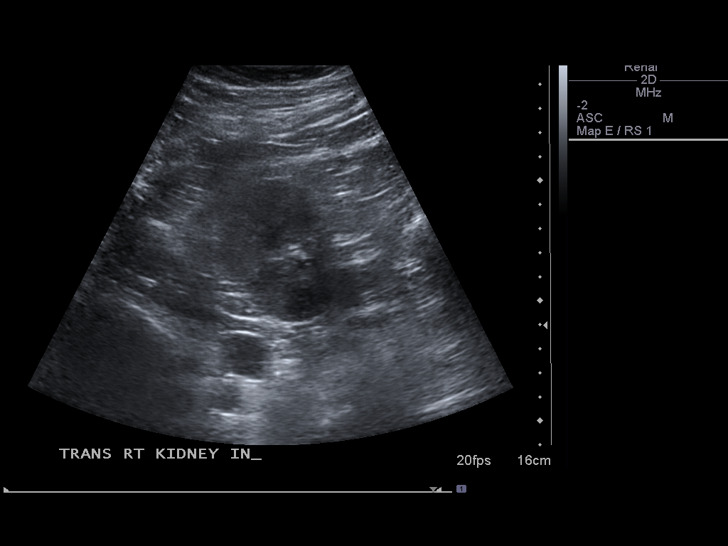
[im 22/48]
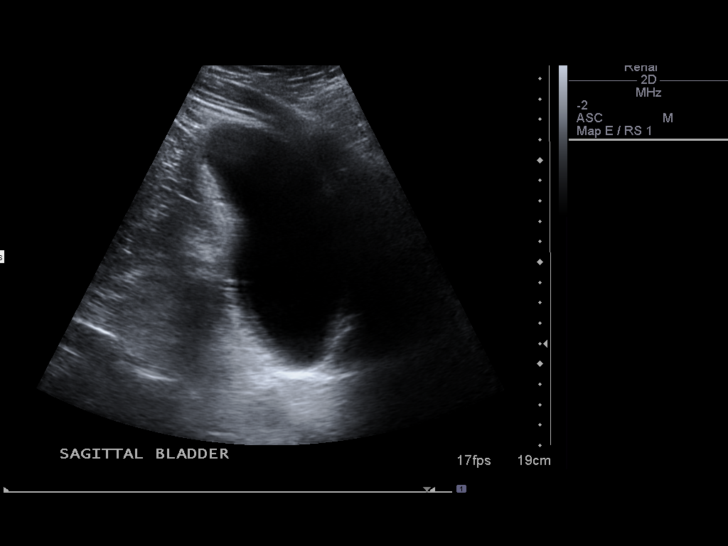
[im 26/48]
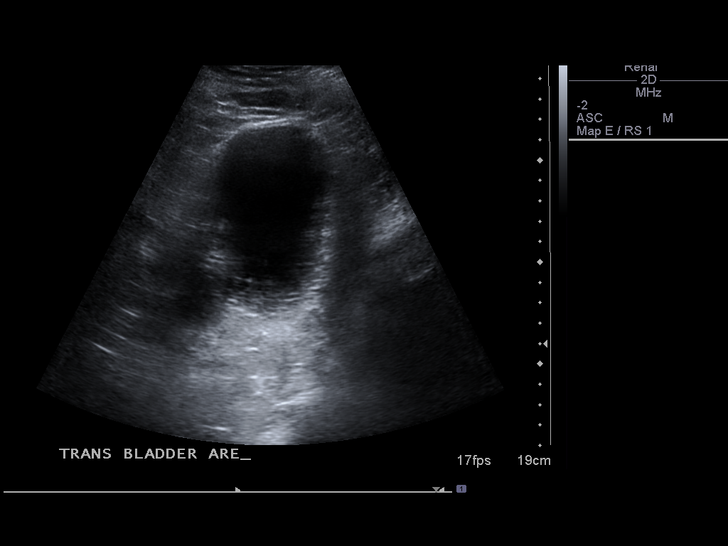
[im 30/48]
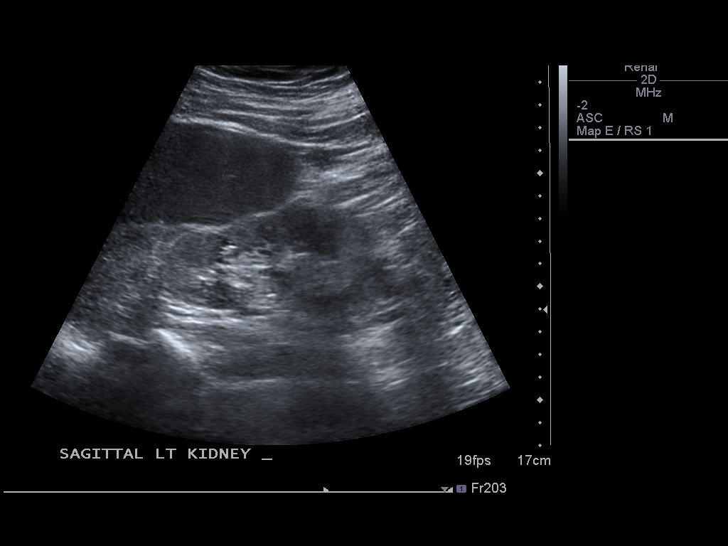
[im 32/48]
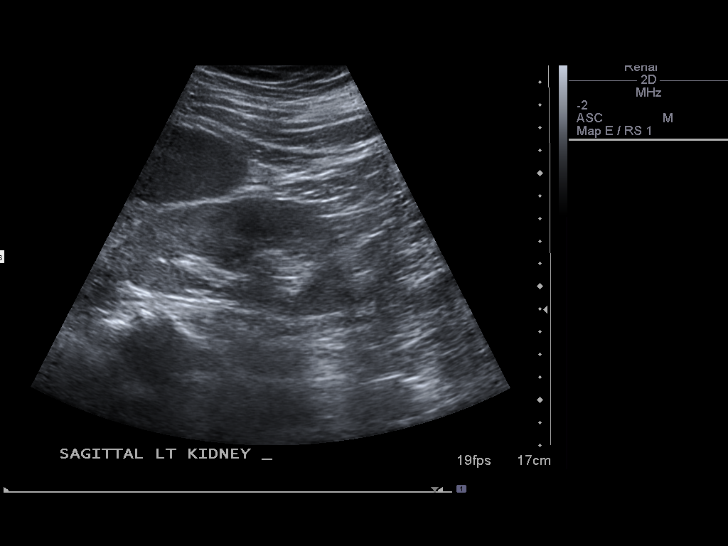
[im 36/48]
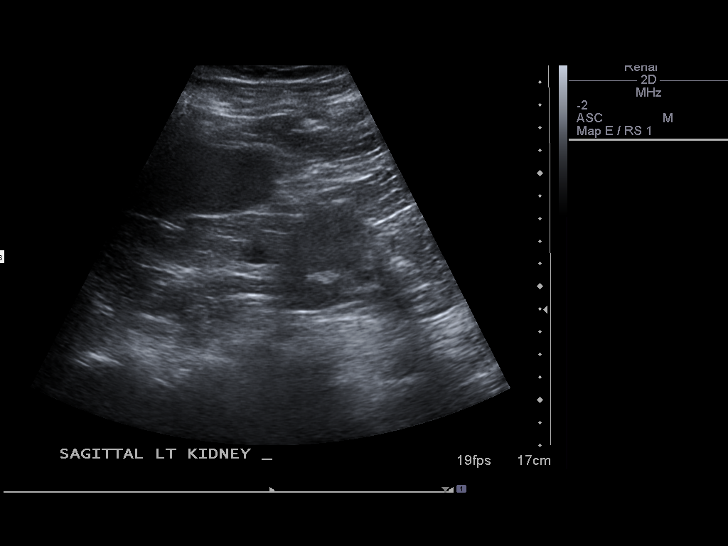
[im 40/48]
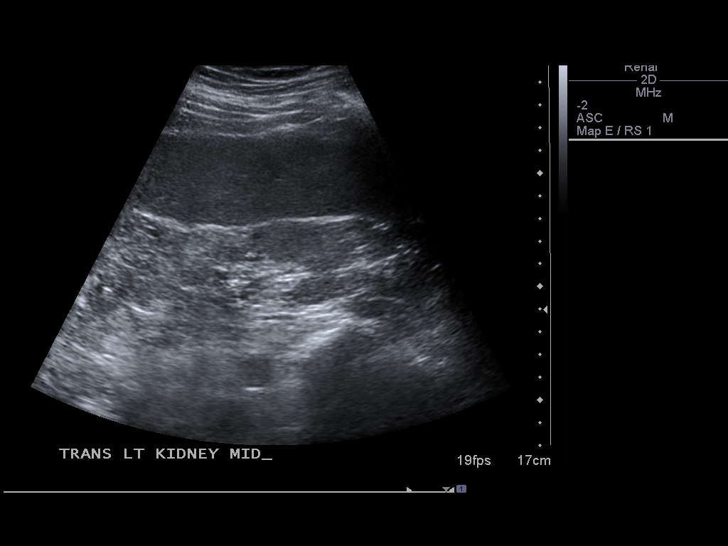
[im 44/48]
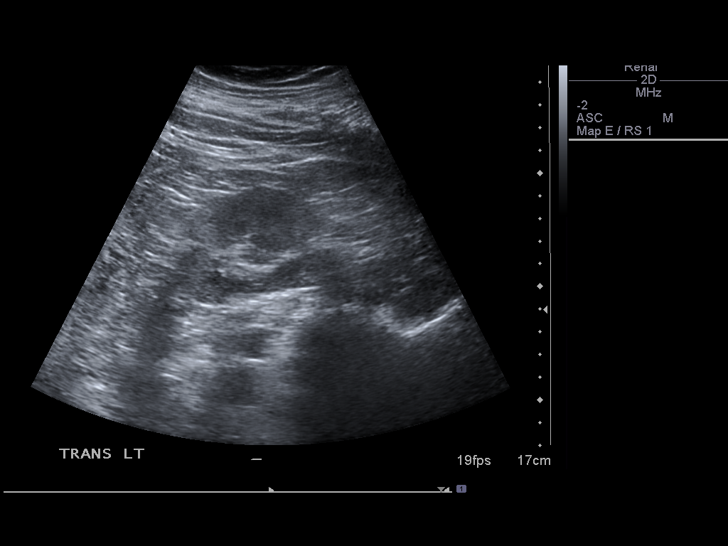
[im 48/48]
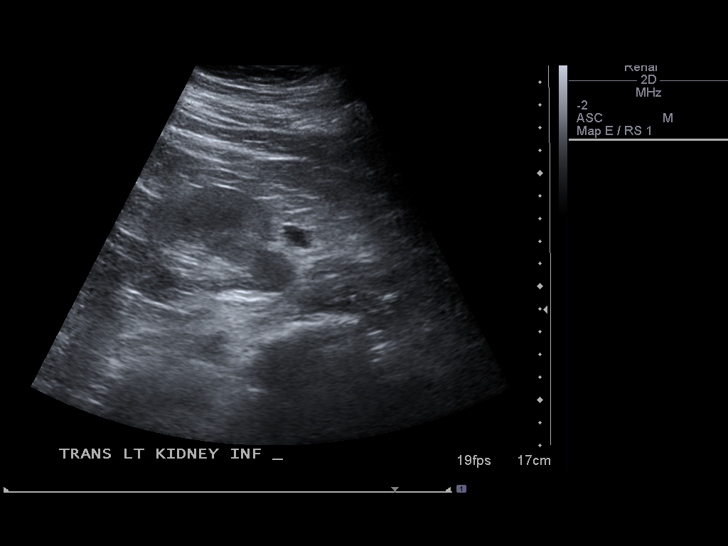

[14 of 25 positions shown; findings below may reference images not displayed]

FINDINGS: Right Kidney:  Measures 12.6 cm.  There is some increased cortical
echogenicity.  No hydronephrosis, stone or mass.

Left Kidney:  Measures 11.2 cm.  There is increased cortical
echogenicity.  1.5 cm simple appearing cyst lower pole is noted and
appears unchanged.  No stone or hydronephrosis.

Bladder:  Appears normal.
IMPRESSION: 1.  Negative for hydronephrosis or other acute abnormality.
2.  Increase in cortical effusion is compatible with medical renal
disease.

## 2013-08-04 ENCOUNTER — Emergency Department (HOSPITAL_COMMUNITY)
Admission: EM | Admit: 2013-08-04 | Discharge: 2013-08-05 | Disposition: A | Discharge status: Home / Self Care | Payer: Medicaid Other | Attending: Emergency Medicine | Admitting: Emergency Medicine

## 2013-08-04 ENCOUNTER — Encounter (HOSPITAL_COMMUNITY): Payer: Self-pay | Admitting: Emergency Medicine

## 2013-08-04 DIAGNOSIS — F3289 Other specified depressive episodes: Secondary | ICD-10-CM

## 2013-08-04 DIAGNOSIS — R748 Abnormal levels of other serum enzymes: Secondary | ICD-10-CM

## 2013-08-04 DIAGNOSIS — G4730 Sleep apnea, unspecified: Secondary | ICD-10-CM

## 2013-08-04 DIAGNOSIS — Z79899 Other long term (current) drug therapy: Secondary | ICD-10-CM | POA: Insufficient documentation

## 2013-08-04 DIAGNOSIS — Z7901 Long term (current) use of anticoagulants: Secondary | ICD-10-CM | POA: Insufficient documentation

## 2013-08-04 DIAGNOSIS — F319 Bipolar disorder, unspecified: Secondary | ICD-10-CM | POA: Insufficient documentation

## 2013-08-04 DIAGNOSIS — I10 Essential (primary) hypertension: Secondary | ICD-10-CM | POA: Insufficient documentation

## 2013-08-04 DIAGNOSIS — F71 Moderate intellectual disabilities: Secondary | ICD-10-CM | POA: Diagnosis present

## 2013-08-04 DIAGNOSIS — Z86718 Personal history of other venous thrombosis and embolism: Secondary | ICD-10-CM | POA: Insufficient documentation

## 2013-08-04 DIAGNOSIS — R45851 Suicidal ideations: Secondary | ICD-10-CM

## 2013-08-04 DIAGNOSIS — F919 Conduct disorder, unspecified: Secondary | ICD-10-CM

## 2013-08-04 DIAGNOSIS — R45850 Homicidal ideations: Secondary | ICD-10-CM | POA: Insufficient documentation

## 2013-08-04 DIAGNOSIS — F329 Major depressive disorder, single episode, unspecified: Secondary | ICD-10-CM

## 2013-08-04 DIAGNOSIS — F209 Schizophrenia, unspecified: Secondary | ICD-10-CM | POA: Insufficient documentation

## 2013-08-04 DIAGNOSIS — IMO0002 Reserved for concepts with insufficient information to code with codable children: Secondary | ICD-10-CM | POA: Insufficient documentation

## 2013-08-04 HISTORY — DX: Acute embolism and thrombosis of unspecified deep veins of unspecified lower extremity: I82.409

## 2013-08-04 HISTORY — DX: Essential (primary) hypertension: I10

## 2013-08-04 LAB — COMPREHENSIVE METABOLIC PANEL
ALK PHOS: 64 U/L (ref 39–117)
ALT: 64 U/L — ABNORMAL HIGH (ref 0–53)
AST: 74 U/L — ABNORMAL HIGH (ref 0–37)
Albumin: 4.2 g/dL (ref 3.5–5.2)
BILIRUBIN TOTAL: 1.1 mg/dL (ref 0.3–1.2)
BUN: 21 mg/dL (ref 6–23)
CHLORIDE: 106 meq/L (ref 96–112)
CO2: 22 mEq/L (ref 19–32)
Calcium: 10 mg/dL (ref 8.4–10.5)
Creatinine, Ser: 1.7 mg/dL — ABNORMAL HIGH (ref 0.50–1.35)
GFR calc non Af Amer: 52 mL/min — ABNORMAL LOW (ref >90)
GFR, EST AFRICAN AMERICAN: 60 mL/min — AB (ref >90)
GLUCOSE: 127 mg/dL — AB (ref 70–99)
POTASSIUM: 4.8 meq/L (ref 3.7–5.3)
Sodium: 142 mEq/L (ref 137–147)
Total Protein: 8.2 g/dL (ref 6.0–8.3)

## 2013-08-04 LAB — CBC
HEMATOCRIT: 39.5 % (ref 39.0–52.0)
HEMOGLOBIN: 12.9 g/dL — AB (ref 13.0–17.0)
MCH: 30.6 pg (ref 26.0–34.0)
MCHC: 32.7 g/dL (ref 30.0–36.0)
MCV: 93.8 fL (ref 78.0–100.0)
Platelets: 161 10*3/uL (ref 150–400)
RBC: 4.21 MIL/uL — ABNORMAL LOW (ref 4.22–5.81)
RDW: 14.7 % (ref 11.5–15.5)
WBC: 5.6 10*3/uL (ref 4.0–10.5)

## 2013-08-04 LAB — ACETAMINOPHEN LEVEL: Acetaminophen (Tylenol), Serum: <15 ug/mL (ref 10–30)

## 2013-08-04 LAB — ETHANOL

## 2013-08-04 LAB — SALICYLATE LEVEL: Salicylate Lvl: <2 mg/dL — ABNORMAL LOW (ref 2.8–20.0)
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
## 2013-08-04 NOTE — ED Notes (Signed)
Report to RN Chinerye.  Pt amb to room 15, main ed.

## 2013-08-04 NOTE — ED Notes (Signed)
Dawn from [employer] called to state a Crisis workup will be done on pt tomorrow, 08/05/13.  Phone# [phone].  RN Kathryne Hitch informed Dispensing optician.

## 2013-08-04 NOTE — ED Notes (Signed)
Notified Charge Nurse Misty Stanley that pt uses 02 and CPAP machine.
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
## 2013-08-04 NOTE — ED Notes (Signed)
Bed: ZO10 Expected date:  Expected time:  Means of arrival:  Comments: For room 43
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
## 2013-08-05 DIAGNOSIS — F919 Conduct disorder, unspecified: Secondary | ICD-10-CM | POA: Diagnosis present

## 2013-08-05 DIAGNOSIS — F71 Moderate intellectual disabilities: Secondary | ICD-10-CM | POA: Diagnosis present

## 2013-08-05 LAB — RAPID URINE DRUG SCREEN, HOSP PERFORMED
Amphetamines: NOT DETECTED
BARBITURATES: NOT DETECTED
Benzodiazepines: NOT DETECTED
Cocaine: NOT DETECTED
Opiates: NOT DETECTED
Tetrahydrocannabinol: NOT DETECTED

## 2013-08-05 NOTE — Progress Notes (Signed)
Per discussion with psychiatrist, patient is psychiatrically stable for dc home. CSW met with [place] [facility] who also agrees that patient does not meet criteria to remain in the hospital. Per [facility], pt mother is working on placing patient in a group home. CSW spoke with pt mother and discuss pt disposition. Pt mother states she does not feel safe with patietn returning home. CSW discussed concerns with providers. Pt is calm and coopeartive in the ED, denies SI/HI/AH/VH. Pt mother agreed to come ad pick up patient once she is able to find a ride. Evening CSW to f/u with patient mother later today regarding transportation needs if patient mother unable to find ride.   Noreene Larsson [phone]  ED CSW 08/05/2013 1319pm

## 2013-08-05 NOTE — Progress Notes (Signed)
Patient is calm and cooperative at discharge. Patient's mother is waiting in lobby for patient.

## 2013-08-05 NOTE — Discharge Instructions (Signed)
Aggression Physically aggressive behavior is common among small children. When frustrated or angry, toddlers may act out. Often, they will push, bite, or hit. Most children show less physical aggression as they grow up. Their language and interpersonal skills improve, too. But continued aggressive behavior is a sign of a problem. This behavior can lead to aggression and delinquency in adolescence and adulthood. Aggressive behavior can be psychological or physical. Forms of psychological aggression include threatening or bullying others. Forms of physical aggression include:  Pushing.  Hitting.  Slapping.  Kicking.  Stabbing.  Shooting.  Raping. PREVENTION  Encouraging the following behaviors can help manage aggression:  Respecting others and valuing differences.  Participating in school and community functions, including sports, music, after-school programs, community groups, and volunteer work.  Talking with an adult when they are sad, depressed, fearful, anxious, or angry. Discussions with a parent or other family member, Veterinary surgeon, Runner, broadcasting/film/video, or coach can help.  Avoiding alcohol and drug use.  Dealing with disagreements without aggression, such as conflict resolution. To learn this, children need parents and caregivers to model respectful communication and problem solving.  Limiting exposure to aggression and violence, such as video games that are not age appropriate, violence in the media, or domestic violence. Document Released: 12/25/2006 Document Revised: 05/22/2011 Document Reviewed: 05/05/2010 [facility] Patient Information 2014 [place], [place].  Depression, Adult Depression is feeling sad, low, down in the dumps, blue, gloomy, or empty. In general, there are two kinds of depression:  Normal sadness or grief. This can happen after something upsetting. It often goes away on its own within 2 weeks. After losing a loved one (bereavement), normal sadness and grief may last  longer than two weeks. It usually gets better with time.  Clinical depression. This kind lasts longer than normal sadness or grief. It keeps you from doing the things you normally do in life. It is often hard to function at home, work, or at school. It may affect your relationships with others. Treatment is often needed. GET HELP RIGHT AWAY IF:  You have thoughts about hurting yourself or others.  You lose touch with reality (psychotic symptoms). You may:  See or hear things that are not real.  Have untrue beliefs about your life or people around you.  Your medicine is giving you problems. MAKE SURE YOU:  Understand these instructions.  Will watch your condition.  Will get help right away if you are not doing well or get worse. Document Released: 04/01/2010 Document Revised: 11/22/2011 Document Reviewed: 06/29/2011 [facility] Patient Information 2014 [place], [place].  Suicidal Feelings, How to Help Yourself Everyone feels sad or unhappy at times, but depressing thoughts and feelings of hopelessness can lead to thoughts of suicide. It can seem as if life is too tough to handle. If you feel as though you have reached the point where suicide is the only answer, it is time to let someone know immediately.  HOW TO COPE AND PREVENT SUICIDE  Let family, friends, teachers, or counselors know. Get help. Try not to isolate yourself from those who care about you. Even though you may not feel sociable, talk with someone every day. It is best if it is face-to-face. Remember, they will want to help you.  Eat a regularly spaced and well-balanced diet.  Get plenty of rest.  Avoid alcohol and drugs because they will only make you feel worse and may also lower your inhibitions. Remove them from the home. If you are thinking of taking an overdose of your prescribed  medicines, give your medicines to someone who can give them to you one day at a time. If you are on antidepressants, let your caregiver know  of your feelings so he or she can provide a safer medicine, if that is a concern.  Remove weapons or poisons from your home.  Try to stick to routines. Follow a schedule and remind yourself that you have to keep that schedule every day.  Set some realistic goals and achieve them. Make a list and cross things off as you go. Accomplishments give a sense of worth. Wait until you are feeling better before doing things you find difficult or unpleasant to do.  If you are able, try to start exercising. Even half-hour periods of exercise each day will make you feel better. Getting out in the sun or into nature helps you recover from depression faster. If you have a favorite place to walk, take advantage of that.  Increase safe activities that have always given you pleasure. This may include playing your favorite music, reading a good book, painting a picture, or playing your favorite instrument. Do whatever takes your mind off your depression.  Keep your living space well-lighted. GET HELP Contact a suicide hotline, crisis center, or local suicide prevention center for help right away. Local centers may include a hospital, clinic, community service organization, social service provider, or health department.  Call your local emergency services (911 in the [place]).  Call a suicide hotline:  1-800-273-TALK (340-261-68381-(657)553-3656) in the [place].  1-800-SUICIDE (331)571-8357(1-(403)297-9792) in the [place].  40534741391-870-292-7251 in the [place] for Spanish-speaking counselors.  4-696-295-2WUX1-800-799-4TTY 413-005-8945(1-(804)247-6414) in the [place] for TTY users.  Visit the following websites for information and help:  National Suicide Prevention Lifeline: www.suicidepreventionlifeline.org  Hopeline: www.hopeline.com  [employer] for Suicide Prevention: https://www.ayers.com/www.afsp.org  For lesbian, gay, bisexual, transgender, or questioning youth, contact [employer]:  3-664-4-I-HKVQQV1-866-4-U-TREVOR 828-233-7278(1-367-852-7313) in the  [place].  www.thetrevorproject.org  In [place], treatment resources are listed in each province with listings available under [employer] or similar titles. Another source for Crisis Centres by [place] is located at http://www.suicideprevention.ca/in-crisis-now/find-a-crisis-centre-now/crisis-centres Document Released: 09/03/2002 Document Revised: 05/22/2011 Document Reviewed: 01/22/2007 [facility] Patient Information 2014 [place].

## 2013-08-05 NOTE — Consult Note (Signed)
[facility] Face-to-Face Psychiatry Consult   Reason for Consult:  Suicidal ideation Referring Physician:  EDP  [name] A Bynum is an 33 y.o. male. Total Time spent with patient: 30 minutes  Assessment: AXIS I:  Conduct Disorder AXIS II:  Mental retardation, severity unknown AXIS III:   Past Medical History  Diagnosis Date  . Hypertension   . DVT (deep venous thrombosis)    AXIS IV:  other psychosocial or environmental problems AXIS V:  61-70 mild symptoms  Plan:  No evidence of imminent risk to self or others at present.   Patient does not meet criteria for psychiatric inpatient admission. Supportive therapy provided about ongoing stressors. Discussed crisis plan, support from social network, calling 911, coming to the Emergency Department, and calling Suicide Hotline.  Subjective:   [name] Larson is a 33 y.o. male patient.  HPI:  Patient states "I stole my moms car and went joy riding.  She woke up and the car was gone and she called the police.  I was gonna bring it back.  I learned to drive playing game.  I don't want to hurt my self.  I don't want to hurt no body else.  I just wanted to joy ride; it won't my first time."  Patient states that he has not been suicidal "I just said that cause I was mad  I love my mom.  She is suppose to be finding me a group home." Patient denies suicidal/homicidal ideation, psychosis, and paranoia.  HPI Elements:   Location:  Conduct disorder. Quality:  Stole mothers car. Severity:  Stole mothers care. Duration:  1 day.  Past Psychiatric History: Past Medical History  Diagnosis Date  . Hypertension   . DVT (deep venous thrombosis)     reports that he has never smoked. He does not have any smokeless tobacco history on file. He reports that he does not drink alcohol. His drug history is not on file. No family history on file.         Allergies:   Allergies  Allergen Reactions  . Lamotrigine Shortness Of Breath and Nausea And Vomiting    "I was  about to die"  . Klonopin [Clonazepam] Other (See Comments)    Reaction unknown. From a long time ago.    ACT Assessment Complete:  Yes:    Educational Status    Risk to Self: Risk to self Is patient at risk for suicide?: No, but patient needs Medical Clearance Substance abuse history and/or treatment for substance abuse?: No  Risk to Others:    Abuse:    Prior Inpatient Therapy:    Prior Outpatient Therapy:    Additional Information:                    Objective: Blood pressure 107/54, pulse 58, temperature 98.1 F (36.7 C), temperature source Oral, resp. rate 18, SpO2 98.00%.There is no height or weight on file to calculate BMI. Results for orders placed during the hospital encounter of 08/04/13 (from the past 72 hour(s))  ACETAMINOPHEN LEVEL     Status: None   Collection Time    08/04/13  5:29 PM      Result Value Ref Range   Acetaminophen (Tylenol), Serum <15.0  10 - 30 ug/mL   Comment:            THERAPEUTIC CONCENTRATIONS VARY     SIGNIFICANTLY. A RANGE OF 10-30     ug/mL MAY BE AN EFFECTIVE  CONCENTRATION FOR MANY PATIENTS.     HOWEVER, SOME ARE BEST TREATED     AT CONCENTRATIONS OUTSIDE THIS     RANGE.     ACETAMINOPHEN CONCENTRATIONS     >150 ug/mL AT 4 HOURS AFTER     INGESTION AND >50 ug/mL AT 12     HOURS AFTER INGESTION ARE     OFTEN ASSOCIATED WITH TOXIC     REACTIONS.  CBC     Status: Abnormal   Collection Time    08/04/13  5:29 PM      Result Value Ref Range   WBC 5.6  4.0 - 10.5 K/uL   RBC 4.21 (*) 4.22 - 5.81 MIL/uL   Hemoglobin 12.9 (*) 13.0 - 17.0 g/dL   HCT 39.5  39.0 - 52.0 %   MCV 93.8  78.0 - 100.0 fL   MCH 30.6  26.0 - 34.0 pg   MCHC 32.7  30.0 - 36.0 g/dL   RDW 14.7  11.5 - 15.5 %   Platelets 161  150 - 400 K/uL  COMPREHENSIVE METABOLIC PANEL     Status: Abnormal   Collection Time    08/04/13  5:29 PM      Result Value Ref Range   Sodium 142  137 - 147 mEq/L   Potassium 4.8  3.7 - 5.3 mEq/L   Chloride 106  96 - 112  mEq/L   CO2 22  19 - 32 mEq/L   Glucose, Bld 127 (*) 70 - 99 mg/dL   BUN 21  6 - 23 mg/dL   Creatinine, Ser 1.70 (*) 0.50 - 1.35 mg/dL   Calcium 10.0  8.4 - 10.5 mg/dL   Total Protein 8.2  6.0 - 8.3 g/dL   Albumin 4.2  3.5 - 5.2 g/dL   AST 74 (*) 0 - 37 U/L   ALT 64 (*) 0 - 53 U/L   Alkaline Phosphatase 64  39 - 117 U/L   Total Bilirubin 1.1  0.3 - 1.2 mg/dL   GFR calc non Af Amer 52 (*) >90 mL/min   GFR calc Af Amer 60 (*) >90 mL/min   Comment: (NOTE)     The eGFR has been calculated using the CKD EPI equation.     This calculation has not been validated in all clinical situations.     eGFR's persistently <90 mL/min signify possible Chronic Kidney     Disease.  ETHANOL     Status: None   Collection Time    08/04/13  5:29 PM      Result Value Ref Range   Alcohol, Ethyl (B) <11  0 - 11 mg/dL   Comment:            LOWEST DETECTABLE LIMIT FOR     SERUM ALCOHOL IS 11 mg/dL     FOR MEDICAL PURPOSES ONLY  SALICYLATE LEVEL     Status: Abnormal   Collection Time    08/04/13  5:29 PM      Result Value Ref Range   Salicylate Lvl <1.1 (*) 2.8 - 20.0 mg/dL  URINE RAPID DRUG SCREEN (HOSP PERFORMED)     Status: None   Collection Time    08/05/13  8:07 AM      Result Value Ref Range   Opiates NONE DETECTED  NONE DETECTED   Cocaine NONE DETECTED  NONE DETECTED   Benzodiazepines NONE DETECTED  NONE DETECTED   Amphetamines NONE DETECTED  NONE DETECTED   Tetrahydrocannabinol NONE DETECTED  NONE DETECTED  Barbiturates NONE DETECTED  NONE DETECTED   Comment:            DRUG SCREEN FOR MEDICAL PURPOSES     ONLY.  IF CONFIRMATION IS NEEDED     FOR ANY PURPOSE, NOTIFY LAB     WITHIN 5 DAYS.                LOWEST DETECTABLE LIMITS     FOR URINE DRUG SCREEN     Drug Class       Cutoff (ng/mL)     Amphetamine      1000     Barbiturate      200     Benzodiazepine   387     Tricyclics       564     Opiates          300     Cocaine          300     THC              50   Labs are  reviewed and home medications reviewed.  No changes  No current facility-administered medications for this encounter.   Current Outpatient Prescriptions  Medication Sig Dispense Refill  . Choline Fenofibrate 135 MG capsule Take 135 mg by mouth daily.      . divalproex (DEPAKOTE ER) 500 MG 24 hr tablet Take 500 mg by mouth at bedtime.      . fenofibrate 160 MG tablet Take 160 mg by mouth daily.      Marland Kitchen lisinopril (PRINIVIL,ZESTRIL) 5 MG tablet Take 5 mg by mouth daily.      Marland Kitchen lovastatin (MEVACOR) 40 MG tablet Take 40 mg by mouth at bedtime.      . mometasone (NASONEX) 50 MCG/ACT nasal spray Place 2 sprays into the nose daily.      Marland Kitchen omega-3 acid ethyl esters (LOVAZA) 1 G capsule Take 2 g by mouth 2 (two) times daily.      . rivaroxaban (XARELTO) 20 MG TABS tablet Take 20 mg by mouth daily with supper.      . Paliperidone Palmitate (INVEGA SUSTENNA) 117 MG/0.75ML SUSP Inject 117 mg into the muscle every 30 (thirty) days.         Psychiatric Specialty Exam:     Blood pressure 107/54, pulse 58, temperature 98.1 F (36.7 C), temperature source Oral, resp. rate 18, SpO2 98.00%.There is no height or weight on file to calculate BMI.  General Appearance: Casual  Eye Contact::  Good  Speech:  Clear and Coherent and Normal Rate  Volume:  Normal  Mood:  Anxious  Affect:  Congruent  Thought Process:  Circumstantial  Orientation:  Full (Time, Place, and Person)  Thought Content:  "I was just joy riding  Suicidal Thoughts:  No  Homicidal Thoughts:  No  Memory:  Immediate;   Good Recent;   Good Remote;   Good  Judgement:  Impaired  Insight:  Fair  Psychomotor Activity:  Normal  Concentration:  Fair  Recall:  Good  Fund of Knowledge:Fair  Language: Good  Akathisia:  No  Handed:  Right  AIMS (if indicated):     Assets:  Communication Skills Social Support  Sleep:      Musculoskeletal: Strength & Muscle Tone: within normal limits Gait & Station: normal Patient leans: N/A  Treatment  Plan Summary: Outpatient services  Disposition:  Discharge home.  Give resource information for group homes in area.   Discharge Assessment  Demographic Factors:  Male and Caucasian  Total Time spent with patient: 30 minutes  Psychiatric Specialty Exam: Same as above  Musculoskeletal: Same as above  Mental Status Per Nursing Assessment::   On Admission:     Current Mental Status by Physician: Patient denies suicidal/homicidal ideation, psychosis, and paranoia  Loss Factors: NA  Historical Factors: NA  Risk Reduction Factors:   NA  Continued Clinical Symptoms:  Medical Diagnoses and Treatments/Surgeries  Cognitive Features That Contribute To Risk:  Loss of executive function    Suicide Risk:  Minimal: No identifiable suicidal ideation.  Patients presenting with no risk factors but with morbid ruminations; may be classified as minimal risk based on the severity of the depressive symptoms  Discharge Diagnoses: Same as above   Plan Of Care/Follow-up recommendations:  Activity:  Resume usual activity Diet:  Resume usual diet  Is patient on multiple antipsychotic therapies at discharge:  No   Has Patient had three or more failed trials of antipsychotic monotherapy by history:  No  Recommended Plan for Multiple Antipsychotic Therapies: NA   Quinterius Gaida, FNP-BC 08/05/2013 1:53 PM

## 2013-08-05 NOTE — ED Notes (Signed)
CPAP is in Clean Supply Room per Respiratory.

## 2013-08-05 NOTE — ED Notes (Addendum)
Patient sleepy, but awake. Patient is calm and cooperative. Personal Belongings are in [address].

## 2013-08-06 NOTE — Consult Note (Signed)
Face to face evaluation and I agree with this note 

## 2015-01-16 ENCOUNTER — Emergency Department (HOSPITAL_COMMUNITY): Payer: Medicaid Other

## 2015-01-16 ENCOUNTER — Emergency Department (HOSPITAL_COMMUNITY)
Admission: EM | Admit: 2015-01-16 | Discharge: 2015-01-16 | Disposition: A | Discharge status: Home / Self Care | Payer: Medicaid Other | Attending: Emergency Medicine | Admitting: Emergency Medicine

## 2015-01-16 ENCOUNTER — Encounter (HOSPITAL_COMMUNITY): Payer: Self-pay | Admitting: *Deleted

## 2015-01-16 DIAGNOSIS — N390 Urinary tract infection, site not specified: Secondary | ICD-10-CM | POA: Insufficient documentation

## 2015-01-16 DIAGNOSIS — Z7951 Long term (current) use of inhaled steroids: Secondary | ICD-10-CM | POA: Diagnosis not present

## 2015-01-16 DIAGNOSIS — R350 Frequency of micturition: Secondary | ICD-10-CM | POA: Diagnosis present

## 2015-01-16 DIAGNOSIS — R05 Cough: Secondary | ICD-10-CM | POA: Insufficient documentation

## 2015-01-16 DIAGNOSIS — R059 Cough, unspecified: Secondary | ICD-10-CM

## 2015-01-16 DIAGNOSIS — Z86718 Personal history of other venous thrombosis and embolism: Secondary | ICD-10-CM | POA: Insufficient documentation

## 2015-01-16 DIAGNOSIS — I10 Essential (primary) hypertension: Secondary | ICD-10-CM | POA: Insufficient documentation

## 2015-01-16 DIAGNOSIS — R000 Tachycardia, unspecified: Secondary | ICD-10-CM | POA: Diagnosis not present

## 2015-01-16 DIAGNOSIS — R21 Rash and other nonspecific skin eruption: Secondary | ICD-10-CM | POA: Insufficient documentation

## 2015-01-16 DIAGNOSIS — Z7901 Long term (current) use of anticoagulants: Secondary | ICD-10-CM | POA: Insufficient documentation

## 2015-01-16 DIAGNOSIS — R5081 Fever presenting with conditions classified elsewhere: Secondary | ICD-10-CM | POA: Insufficient documentation

## 2015-01-16 DIAGNOSIS — Z720 Tobacco use: Secondary | ICD-10-CM | POA: Insufficient documentation

## 2015-01-16 DIAGNOSIS — F419 Anxiety disorder, unspecified: Secondary | ICD-10-CM | POA: Insufficient documentation

## 2015-01-16 DIAGNOSIS — R509 Fever, unspecified: Secondary | ICD-10-CM

## 2015-01-16 DIAGNOSIS — Z79899 Other long term (current) drug therapy: Secondary | ICD-10-CM | POA: Insufficient documentation

## 2015-01-16 HISTORY — DX: Disorder of kidney and ureter, unspecified: N28.9

## 2015-01-16 HISTORY — DX: Anxiety disorder, unspecified: F41.9

## 2015-01-16 LAB — BASIC METABOLIC PANEL
ANION GAP: 6 (ref 5–15)
BUN: 18 mg/dL (ref 6–20)
CO2: 23 mmol/L (ref 22–32)
Calcium: 8.9 mg/dL (ref 8.9–10.3)
Chloride: 113 mmol/L — ABNORMAL HIGH (ref 101–111)
Creatinine, Ser: 1.76 mg/dL — ABNORMAL HIGH (ref 0.61–1.24)
GFR calc Af Amer: 57 mL/min — ABNORMAL LOW (ref >60)
GFR calc non Af Amer: 49 mL/min — ABNORMAL LOW (ref >60)
GLUCOSE: 122 mg/dL — AB (ref 65–99)
POTASSIUM: 4.4 mmol/L (ref 3.5–5.1)
SODIUM: 142 mmol/L (ref 135–145)

## 2015-01-16 LAB — CBC WITH DIFFERENTIAL/PLATELET
BASOS ABS: 0 10*3/uL (ref 0.0–0.1)
Basophils Relative: 0 %
Eosinophils Absolute: 0 10*3/uL (ref 0.0–0.7)
Eosinophils Relative: 0 %
HCT: 41.7 % (ref 39.0–52.0)
Hemoglobin: 14.2 g/dL (ref 13.0–17.0)
LYMPHS PCT: 7 %
Lymphs Abs: 0.7 10*3/uL (ref 0.7–4.0)
MCH: 30 pg (ref 26.0–34.0)
MCHC: 34.1 g/dL (ref 30.0–36.0)
MCV: 88 fL (ref 78.0–100.0)
Monocytes Absolute: 1.1 10*3/uL — ABNORMAL HIGH (ref 0.1–1.0)
Monocytes Relative: 12 %
Neutro Abs: 7.8 10*3/uL — ABNORMAL HIGH (ref 1.7–7.7)
Neutrophils Relative %: 81 %
Platelets: 120 10*3/uL — ABNORMAL LOW (ref 150–400)
RBC: 4.74 MIL/uL (ref 4.22–5.81)
RDW: 14.9 % (ref 11.5–15.5)
WBC: 9.6 10*3/uL (ref 4.0–10.5)

## 2015-01-16 LAB — URINALYSIS, ROUTINE W REFLEX MICROSCOPIC
Bilirubin Urine: NEGATIVE
Glucose, UA: NEGATIVE mg/dL
Ketones, ur: NEGATIVE mg/dL
NITRITE: NEGATIVE
PROTEIN: 100 mg/dL — AB
Specific Gravity, Urine: 1.015 (ref 1.005–1.030)
UROBILINOGEN UA: 0.2 mg/dL (ref 0.0–1.0)
pH: 7.5 (ref 5.0–8.0)

## 2015-01-16 LAB — I-STAT CG4 LACTIC ACID, ED: Lactic Acid, Venous: 0.81 mmol/L (ref 0.5–2.0)

## 2015-01-16 LAB — URINE MICROSCOPIC-ADD ON

## 2015-01-16 MED ORDER — SODIUM CHLORIDE 0.9 % IV BOLUS (SEPSIS)
1000.0000 mL | Freq: Once | INTRAVENOUS | Status: AC
  Administered 2015-01-16: 1000 mL via INTRAVENOUS

## 2015-01-16 MED ORDER — ACETAMINOPHEN 325 MG PO TABS
650.0000 mg | ORAL_TABLET | Freq: Once | ORAL | Status: AC
  Administered 2015-01-16: 650 mg via ORAL

## 2015-01-16 MED ORDER — ACETAMINOPHEN 325 MG PO TABS
ORAL_TABLET | ORAL | Status: AC
  Filled 2015-01-16: qty 2

## 2015-01-16 MED ORDER — CIPROFLOXACIN HCL 500 MG PO TABS: 500.0000 mg | ORAL_TABLET | Freq: Two times a day (BID) | ORAL | Status: DC

## 2015-01-16 MED ORDER — CEFTRIAXONE SODIUM 1 G IJ SOLR
1.0000 g | Freq: Once | INTRAMUSCULAR | Status: AC
  Administered 2015-01-16: 1 g via INTRAVENOUS
  Filled 2015-01-16: qty 10

## 2015-01-16 NOTE — ED Notes (Signed)
D/c papers/prescription given and reviewed with patient and representative from [facility]. Both verbalized understanding. Patient escorted out via wheelchair. Patient states he doesn't have glasses, but no glasses present in room. Room checked by two personnel members, all linens checked for glasses. Patient aware of no glasses in room at discharge.

## 2015-01-16 NOTE — Discharge Instructions (Signed)
Take tylenol every 4 hours as needed and if over 6 mo of age take motrin (ibuprofen) every 6 hours as needed for fever or pain. Return for any changes, weird rashes, neck stiffness, change in behavior, new or worsening concerns.  Follow up with your physician as directed. Thank you Filed Vitals:   01/16/15 1429 01/16/15 1430 01/16/15 1445 01/16/15 1500  BP: 119/72     Pulse: 92 92 88 92  Temp: 99.5 F (37.5 C)     TempSrc: Oral     Resp: 20     Height:      SpO2: 90% 90% 92% 100%

## 2015-01-16 NOTE — ED Provider Notes (Signed)
CSN: [id number]     Arrival date & time 01/16/15  1135 History  By signing my name below, I, Soijett Blue, attest that this documentation has been prepared under the direction and in the presence of Blane Ohara, MD. Electronically Signed: Soijett Blue, ED Scribe. 01/16/2015. 12:41 PM.   Chief Complaint  Patient presents with  . Urinary Frequency  . Dizziness      The history is provided by the patient. No language interpreter was used.    HPI Comments: [name] Larson is a 34 y.o. male with a medical hx of HTN, bipolar disorder, schizoaffective, and kleinfelders, who presents to the Emergency Department complaining of dry cough onset last night. He states that he is having associated symptoms of rash to his buttock. He states that he has not tried any medications for the relief for his symptoms. He denies fever, chills, HA, sore throat, diarrhea, and any other symptoms. Denies sick contacts or recent travel outside the country. Denies medical hx of DM. He states that he takes lisinopril for his HTN. He reports smoking use but no alcohol at this time. He reports that he has a medical hx of DVT that he is on medication for currently. Pt stays in a group home currently.   Past Medical History  Diagnosis Date  . Hypertension   . DVT (deep venous thrombosis) (HCC)   . Renal disorder     kideny failure  . Anxiety    History reviewed. No pertinent past surgical history. No family history on file. Social History  Substance Use Topics  . Smoking status: Current Every Day Smoker -- 0.50 packs/day    Types: Cigarettes  . Smokeless tobacco: None  . Alcohol Use: No    Review of Systems  Constitutional: Negative for fever and chills.  HENT: Negative for sore throat.   Gastrointestinal: Negative for diarrhea.  Genitourinary: Positive for frequency.  Skin: Positive for rash.  Neurological: Negative for headaches.  All other systems reviewed and are negative.     Allergies  Lamotrigine;  Clozaril; and Klonopin  Home Medications   Prior to Admission medications   Medication Sig Start Date End Date Taking? Authorizing Provider  gemfibrozil (LOPID) 600 MG tablet Take 600 mg by mouth 2 (two) times daily before a meal.   Yes Historical Provider, MD  hydrOXYzine (VISTARIL) 25 MG capsule Take 25 mg by mouth daily.   Yes Historical Provider, MD  lovastatin (MEVACOR) 40 MG tablet Take 40 mg by mouth at bedtime.   Yes Historical Provider, MD  miconazole (MICOTIN) 2 % powder Apply 1 application topically 2 (two) times daily.   Yes Historical Provider, MD  mometasone (NASONEX) 50 MCG/ACT nasal spray Place 2 sprays into the nose daily.   Yes Historical Provider, MD  omega-3 acid ethyl esters (LOVAZA) 1 G capsule Take 4 g by mouth daily.    Yes Historical Provider, MD  paliperidone (INVEGA SUSTENNA) 234 MG/1.5ML SUSP injection Inject 234 mg into the muscle once.   Yes Historical Provider, MD  rivaroxaban (XARELTO) 20 MG TABS tablet Take 20 mg by mouth daily.    Yes Historical Provider, MD  sertraline (ZOLOFT) 50 MG tablet Take 50 mg by mouth daily.   Yes Historical Provider, MD  sodium bicarbonate 650 MG tablet Take 650 mg by mouth 2 (two) times daily.   Yes Historical Provider, MD  ciprofloxacin (CIPRO) 500 MG tablet Take 1 tablet (500 mg total) by mouth 2 (two) times daily. One po bid x  7 days 01/16/15   Blane Ohara, MD   BP 119/72 mmHg  Pulse 92  Temp(Src) 99.5 F (37.5 C) (Oral)  Resp 20  Ht  (1.905 m)  SpO2 100% Physical Exam  Constitutional: He is oriented to person, place, and time. He appears well-developed and well-nourished. No distress.  HENT:  Head: Normocephalic and atraumatic.  Mouth/Throat: Oropharynx is clear and moist. Mucous membranes are dry (mild).  No unilateral swelling or exudate.  Eyes: EOM are normal.  Neck: Neck supple.  Cardiovascular: Regular rhythm and normal heart sounds.  Tachycardia present.  Exam reveals no gallop and no friction rub.   No  murmur heard. Pulmonary/Chest: Effort normal and breath sounds normal. No respiratory distress. He has no wheezes. He has no rales.  Mild upper airway transmission. Lungs overall clear.   Abdominal: Soft. There is no tenderness.  No focal tenderness  Musculoskeletal: Normal range of motion.  No significant swelling of legs  Neurological: He is alert and oriented to person, place, and time.  Skin: Skin is warm and dry.  Dry skin in the lower extremity with some chronic skin discoloration.  Psychiatric: He has a normal mood and affect. His behavior is normal.  Nursing note and vitals reviewed.   ED Course  Procedures (including critical care time) DIAGNOSTIC STUDIES: Oxygen Saturation is 95% on RA, nl by my interpretation.    COORDINATION OF CARE: 12:26 PM Discussed treatment plan with pt at bedside which includes UA, CXR, and labs and pt agreed to plan.    Labs Review Labs Reviewed  URINALYSIS, ROUTINE W REFLEX MICROSCOPIC (NOT AT [facility]) - Abnormal; Notable for the following:    Hgb urine dipstick SMALL (*)    Protein, ur 100 (*)    Leukocytes, UA MODERATE (*)    All other components within normal limits  CBC WITH DIFFERENTIAL/PLATELET - Abnormal; Notable for the following:    Platelets 120 (*)    Neutro Abs 7.8 (*)    Monocytes Absolute 1.1 (*)    All other components within normal limits  BASIC METABOLIC PANEL - Abnormal; Notable for the following:    Chloride 113 (*)    Glucose, Bld 122 (*)    Creatinine, Ser 1.76 (*)    GFR calc non Af Amer 49 (*)    GFR calc Af Amer 57 (*)    All other components within normal limits  URINE CULTURE  URINE MICROSCOPIC-ADD ON  I-STAT CG4 LACTIC ACID, ED    Imaging Review Dg Chest 2 View  01/16/2015  CLINICAL DATA:  Cough EXAM: CHEST  2 VIEW COMPARISON:  02/15/2010; 11/05/2009; 10/31/2009 FINDINGS: Examination is degraded due to patient body habitus. Grossly unchanged cardiac silhouette and mediastinal contours given patient  rotation. There is persistent thickening of the right paratracheal stripe presumably secondary to prominent vasculature. Evaluation the retrosternal clear space obscured secondary to overlying soft tissues. Linear heterogeneous opacities radiating from the bilateral hila are favored to represent atelectasis or scar. No new focal airspace opacities. No pleural effusion or pneumothorax. No evidence of edema. No acute osseus abnormalities. IMPRESSION: Bilateral infrahilar atelectasis without acute cardiopulmonary disease. Electronically Signed   By: Simonne Come M.D.   On: 01/16/2015 13:23   I have personally reviewed and evaluated these images and lab results as part of my medical decision-making.   EKG Interpretation None      MDM   Final diagnoses:  UTI (lower urinary tract infection)  Other specified fever  Cough   Urinalysis  consistent with infection and patient has urinary frequency. Patient improved with IV fluids, antibiotics given. Patient improved clinically and reassessment. Patient stable for close outpatient follow-up. Discussed this with the caregiver as well as the patient.  Results and differential diagnosis were discussed with the patient/parent/guardian. Xrays were independently reviewed by myself.  Close follow up outpatient was discussed, comfortable with the plan.   Medications  sodium chloride 0.9 % bolus 1,000 mL (0 mLs Intravenous Stopped 01/16/15 1429)  acetaminophen (TYLENOL) tablet 650 mg (650 mg Oral Given 01/16/15 1303)  cefTRIAXone (ROCEPHIN) 1 g in dextrose 5 % 50 mL IVPB (0 g Intravenous Stopped 01/16/15 1429)    Filed Vitals:   01/16/15 1429 01/16/15 1430 01/16/15 1445 01/16/15 1500  BP: 119/72     Pulse: 92 92 88 92  Temp: 99.5 F (37.5 C)     TempSrc: Oral     Resp: 20     Height:      SpO2: 90% 90% 92% 100%    Final diagnoses:  UTI (lower urinary tract infection)  Other specified fever  Cough       Blane OharaJoshua Sharlotte Baka, MD 01/16/15 1554

## 2015-01-16 NOTE — ED Notes (Signed)
Patient tolerating fluids well.

## 2015-01-16 NOTE — ED Notes (Signed)
Pt states fever, urinary frequency and dizziness started yesterday. Also states headache.

## 2015-01-19 LAB — URINE CULTURE

## 2015-01-20 ENCOUNTER — Telehealth (HOSPITAL_BASED_OUTPATIENT_CLINIC_OR_DEPARTMENT_OTHER): Payer: Self-pay | Admitting: Emergency Medicine

## 2015-01-20 NOTE — Telephone Encounter (Signed)
Post ED Visit - Positive Culture Follow-up  Culture report reviewed by antimicrobial stewardship pharmacist:  []  Enzo BiNathan Batchelder, Pharm.D. []  Celedonio MiyamotoJeremy Frens, Pharm.D., BCPS []  Garvin FilaMike Maccia, Pharm.D. []  Georgina PillionElizabeth Martin, Pharm.D., BCPS []  ElonMinh Pham, [address]Pharm[address]D., BCPS, AAHIVP []  Estella HuskMichelle Turner, Pharm.D., BCPS, AAHIVP []  Tennis Mustassie Stewart, Pharm.D. [x]  Sherle Poeob Vincent, [address]Pharm[address]  Positive urine culture E. coli Treated with ciprofloxacin, organism sensitive to the same and no further patient follow-up is required at this time.  Berle MullMiller, Johntae Broxterman 01/20/2015, 11:33 AM

## 2015-02-28 ENCOUNTER — Encounter (HOSPITAL_COMMUNITY): Payer: Self-pay | Admitting: Emergency Medicine

## 2015-02-28 ENCOUNTER — Emergency Department (HOSPITAL_COMMUNITY)
Admission: EM | Admit: 2015-02-28 | Discharge: 2015-03-01 | Disposition: A | Discharge status: Other Acute Inpatient Hospital | Payer: Medicaid Other | Attending: Emergency Medicine | Admitting: Emergency Medicine

## 2015-02-28 DIAGNOSIS — R45851 Suicidal ideations: Secondary | ICD-10-CM | POA: Diagnosis not present

## 2015-02-28 DIAGNOSIS — Z87448 Personal history of other diseases of urinary system: Secondary | ICD-10-CM | POA: Insufficient documentation

## 2015-02-28 DIAGNOSIS — Y998 Other external cause status: Secondary | ICD-10-CM | POA: Diagnosis not present

## 2015-02-28 DIAGNOSIS — S1081XA Abrasion of other specified part of neck, initial encounter: Secondary | ICD-10-CM | POA: Diagnosis not present

## 2015-02-28 DIAGNOSIS — X58XXXA Exposure to other specified factors, initial encounter: Secondary | ICD-10-CM | POA: Diagnosis not present

## 2015-02-28 DIAGNOSIS — Z7901 Long term (current) use of anticoagulants: Secondary | ICD-10-CM | POA: Insufficient documentation

## 2015-02-28 DIAGNOSIS — S60511A Abrasion of right hand, initial encounter: Secondary | ICD-10-CM | POA: Insufficient documentation

## 2015-02-28 DIAGNOSIS — Z008 Encounter for other general examination: Secondary | ICD-10-CM | POA: Diagnosis present

## 2015-02-28 DIAGNOSIS — I10 Essential (primary) hypertension: Secondary | ICD-10-CM | POA: Insufficient documentation

## 2015-02-28 DIAGNOSIS — S60512A Abrasion of left hand, initial encounter: Secondary | ICD-10-CM | POA: Insufficient documentation

## 2015-02-28 DIAGNOSIS — Y92009 Unspecified place in unspecified non-institutional (private) residence as the place of occurrence of the external cause: Secondary | ICD-10-CM | POA: Insufficient documentation

## 2015-02-28 DIAGNOSIS — R4689 Other symptoms and signs involving appearance and behavior: Secondary | ICD-10-CM

## 2015-02-28 DIAGNOSIS — Y9389 Activity, other specified: Secondary | ICD-10-CM | POA: Insufficient documentation

## 2015-02-28 DIAGNOSIS — Z86718 Personal history of other venous thrombosis and embolism: Secondary | ICD-10-CM | POA: Diagnosis not present

## 2015-02-28 DIAGNOSIS — Z7951 Long term (current) use of inhaled steroids: Secondary | ICD-10-CM | POA: Insufficient documentation

## 2015-02-28 DIAGNOSIS — F419 Anxiety disorder, unspecified: Secondary | ICD-10-CM | POA: Diagnosis not present

## 2015-02-28 DIAGNOSIS — R4589 Other symptoms and signs involving emotional state: Secondary | ICD-10-CM

## 2015-02-28 DIAGNOSIS — Z79899 Other long term (current) drug therapy: Secondary | ICD-10-CM | POA: Insufficient documentation

## 2015-02-28 DIAGNOSIS — F17210 Nicotine dependence, cigarettes, uncomplicated: Secondary | ICD-10-CM | POA: Diagnosis not present

## 2015-02-28 LAB — URINALYSIS, ROUTINE W REFLEX MICROSCOPIC
BILIRUBIN URINE: NEGATIVE
Glucose, UA: NEGATIVE mg/dL
Hgb urine dipstick: NEGATIVE
Ketones, ur: NEGATIVE mg/dL
LEUKOCYTES UA: NEGATIVE
NITRITE: NEGATIVE
Protein, ur: NEGATIVE mg/dL
SPECIFIC GRAVITY, URINE: 1.01 (ref 1.005–1.030)
pH: 6 (ref 5.0–8.0)

## 2015-02-28 LAB — TSH: TSH: 0.626 u[IU]/mL (ref 0.350–4.500)

## 2015-02-28 LAB — SALICYLATE LEVEL

## 2015-02-28 LAB — ACETAMINOPHEN LEVEL

## 2015-02-28 LAB — RAPID URINE DRUG SCREEN, HOSP PERFORMED
Amphetamines: NOT DETECTED
Barbiturates: NOT DETECTED
Benzodiazepines: NOT DETECTED
COCAINE: NOT DETECTED
OPIATES: NOT DETECTED
Tetrahydrocannabinol: NOT DETECTED

## 2015-02-28 LAB — HEPATIC FUNCTION PANEL
ALBUMIN: 3.9 g/dL (ref 3.5–5.0)
ALT: 42 U/L (ref 17–63)
AST: 45 U/L — AB (ref 15–41)
Alkaline Phosphatase: 86 U/L (ref 38–126)
BILIRUBIN DIRECT: 0.2 mg/dL (ref 0.1–0.5)
Indirect Bilirubin: 0.3 mg/dL (ref 0.3–0.9)
TOTAL PROTEIN: 7.5 g/dL (ref 6.5–8.1)
Total Bilirubin: 0.5 mg/dL (ref 0.3–1.2)

## 2015-02-28 LAB — CBC WITH DIFFERENTIAL/PLATELET
BASOS PCT: 0 %
Basophils Absolute: 0 10*3/uL (ref 0.0–0.1)
EOS ABS: 0 10*3/uL (ref 0.0–0.7)
EOS PCT: 0 %
HCT: 38.9 % — ABNORMAL LOW (ref 39.0–52.0)
HEMOGLOBIN: 13.2 g/dL (ref 13.0–17.0)
LYMPHS ABS: 1.9 10*3/uL (ref 0.7–4.0)
Lymphocytes Relative: 34 %
MCH: 29.9 pg (ref 26.0–34.0)
MCHC: 33.9 g/dL (ref 30.0–36.0)
MCV: 88.2 fL (ref 78.0–100.0)
MONOS PCT: 10 %
Monocytes Absolute: 0.5 10*3/uL (ref 0.1–1.0)
NEUTROS PCT: 56 %
Neutro Abs: 3.1 10*3/uL (ref 1.7–7.7)
PLATELETS: 146 10*3/uL — AB (ref 150–400)
RBC: 4.41 MIL/uL (ref 4.22–5.81)
RDW: 14.3 % (ref 11.5–15.5)
WBC: 5.6 10*3/uL (ref 4.0–10.5)

## 2015-02-28 LAB — BASIC METABOLIC PANEL
Anion gap: 8 (ref 5–15)
BUN: 18 mg/dL (ref 6–20)
CALCIUM: 9.2 mg/dL (ref 8.9–10.3)
CO2: 26 mmol/L (ref 22–32)
CREATININE: 1.42 mg/dL — AB (ref 0.61–1.24)
Chloride: 108 mmol/L (ref 101–111)
Glucose, Bld: 115 mg/dL — ABNORMAL HIGH (ref 65–99)
Potassium: 4 mmol/L (ref 3.5–5.1)
SODIUM: 142 mmol/L (ref 135–145)

## 2015-02-28 LAB — ETHANOL: Alcohol, Ethyl (B): <5 mg/dL (ref <5)
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
## 2015-02-28 NOTE — BH Assessment (Signed)
[facility] Assessment Progress Note  Pt referral sent to the following facilities for review:  [facility]  TTS will continue to seek placement for the pt.  Casimer LaniusKristen Talissa Apple, MS, [facility] Therapeutic Triage Specialist [facility]

## 2015-02-28 NOTE — ED Notes (Signed)
Pt has rested quietly since admission this shift with no issues or disturbances. Easily awakened for vitals and followed nurses instruction to obtain urine sample to be sent to lab. Pt ambulated to bathroom and back to room without incident and is now resting quietly.

## 2015-02-28 NOTE — BH Assessment (Signed)
[facility] Assessment Progress Note    [facility] called and requested pt's IQ score before consideration at their facility.  Called [facility], spoke with Latoya Petty [phone] and she stated she would fax psych evaluation with pt's IQ score [facility].  This will be tRhys Martinihen faxed to [facility] for review.  Casimer LaniusKristen Siobahn Worsley, MS, [facility] Therapeutic Triage Specialist [facility]

## 2015-02-28 NOTE — ED Notes (Signed)
Pt's contact information Milda Smartammy Logan Mother (guardian) [phone] [facility] [phone] Albertine PatriciaCynthia Harris [phone]

## 2015-02-28 NOTE — BH Assessment (Signed)
[facility] Assessment Progress Note     Called and scheduled this pt's tele assessment with this clinician.  Casimer LaniusKristen Terrilyn Tyner, MS, [facility] Therapeutic Triage Specialist [facility]

## 2015-02-28 NOTE — ED Provider Notes (Addendum)
CSN: [id number]     Arrival date & time 02/28/15  0014 History   First MD Initiated Contact with Patient 02/28/15 0032     Chief Complaint  Patient presents with  . V70.1     (Consider location/radiation/quality/duration/timing/severity/associated sxs/prior Treatment) HPI  This is a 34 year old male with history of hypertension, anxiety who presents after a suicide attempt. Patient is currently living in a group home. He was found with a cord wrapped around his neck. Per the group home, he was noted to be "blue" but was awake, alert, and talking. Patient reports that he has had many thoughts of hurting himself including jumping out into traffic. He reports prior suicide attempts and has multiple superficial abrasions on the bilateral upper extremities. When asked if he is hearing or seeing anything patient states "I just keep hearing all these things to hurt myself." He will not elaborate. He is a smoker. He denies any drug or alcohol use. He denies any physical symptoms including shortness of breath, chest pain, neck pain.   Past Medical History  Diagnosis Date  . Hypertension   . DVT (deep venous thrombosis) (HCC)   . Renal disorder     kideny failure  . Anxiety    History reviewed. No pertinent past surgical history. History reviewed. No pertinent family history. Social History  Substance Use Topics  . Smoking status: Current Every Day Smoker -- 0.50 packs/day    Types: Cigarettes  . Smokeless tobacco: None  . Alcohol Use: No    Review of Systems  Constitutional: Negative.   Respiratory: Negative.  Negative for chest tightness and shortness of breath.   Cardiovascular: Negative.  Negative for chest pain.  Gastrointestinal: Negative.  Negative for abdominal pain.  Genitourinary: Negative.   Musculoskeletal: Negative for neck stiffness.  Skin: Positive for wound.  Neurological: Negative for headaches.  Psychiatric/Behavioral: Positive for suicidal ideas, hallucinations and  self-injury.  All other systems reviewed and are negative.     Allergies  Lamotrigine; Clozaril; and Klonopin  Home Medications   Prior to Admission medications   Medication Sig Start Date End Date Taking? Authorizing Provider  ciprofloxacin (CIPRO) 500 MG tablet Take 1 tablet (500 mg total) by mouth 2 (two) times daily. One po bid x 7 days 01/16/15   Blane OharaJoshua Zavitz, MD  gemfibrozil (LOPID) 600 MG tablet Take 600 mg by mouth 2 (two) times daily before a meal.    Historical Provider, MD  hydrOXYzine (VISTARIL) 25 MG capsule Take 25 mg by mouth daily.    Historical Provider, MD  lovastatin (MEVACOR) 40 MG tablet Take 40 mg by mouth at bedtime.    Historical Provider, MD  miconazole (MICOTIN) 2 % powder Apply 1 application topically 2 (two) times daily.    Historical Provider, MD  mometasone (NASONEX) 50 MCG/ACT nasal spray Place 2 sprays into the nose daily.    Historical Provider, MD  omega-3 acid ethyl esters (LOVAZA) 1 G capsule Take 4 g by mouth daily.     Historical Provider, MD  paliperidone (INVEGA SUSTENNA) 234 MG/1.5ML SUSP injection Inject 234 mg into the muscle once.    Historical Provider, MD  rivaroxaban (XARELTO) 20 MG TABS tablet Take 20 mg by mouth daily.     Historical Provider, MD  sertraline (ZOLOFT) 50 MG tablet Take 50 mg by mouth daily.    Historical Provider, MD  sodium bicarbonate 650 MG tablet Take 650 mg by mouth 2 (two) times daily.    Historical Provider, MD  BP 111/80 mmHg  Pulse 71  Temp(Src) 98.5 F (36.9 C) (Oral)  Resp 18  Ht  (1.905 m)  Wt 310 lb (140.615 kg)  BMI 38.75 kg/m2  SpO2 93% Physical Exam  Constitutional: He is oriented to person, place, and time. No distress.  HENT:  Head: Normocephalic and atraumatic.  Mouth/Throat: Oropharynx is clear and moist.  Eyes: EOM are normal. Pupils are equal, round, and reactive to light.  Neck: Normal range of motion. Neck supple.  Superficial abrasion most notably over the right lower neck in the  distribution of a cord, no crepitus  Cardiovascular: Normal rate, regular rhythm and normal heart sounds.   No murmur heard. Pulmonary/Chest: Effort normal. No respiratory distress. He has wheezes.  Abdominal: Soft. Bowel sounds are normal. There is no tenderness. There is no rebound.  Musculoskeletal: He exhibits no edema.  Neurological: He is alert and oriented to person, place, and time.  Skin: Skin is warm and dry.  Superficial abrasions bilateral upper extremities consistent with cutting, no petechiae noted over the face or neck  Psychiatric:  Flat affect, will not make eye contact  Nursing note and vitals reviewed.   ED Course  Procedures (including critical care time) Labs Review Labs Reviewed  CBC WITH DIFFERENTIAL/PLATELET - Abnormal; Notable for the following:    HCT 38.9 (*)    Platelets 146 (*)    All other components within normal limits  BASIC METABOLIC PANEL - Abnormal; Notable for the following:    Glucose, Bld 115 (*)    Creatinine, Ser 1.42 (*)    All other components within normal limits  ACETAMINOPHEN LEVEL - Abnormal; Notable for the following:    Acetaminophen (Tylenol), Serum <10 (*)    All other components within normal limits  ETHANOL  SALICYLATE LEVEL  URINE RAPID DRUG SCREEN, HOSP PERFORMED    Imaging Review No results found. I have personally reviewed and evaluated these images and lab results as part of my medical decision-making.   EKG Interpretation None      MDM   Final diagnoses:  Suicidal behavior   Patient presents with a suicidal attempt. Nontoxic on exam. Reports history of the same. No known history of psychotic disorder per the patient. He has a superficial abrasion over the right neck. No crepitus in the neck, oropharynx is clear, no complaints at this time. Do not feel that he needs further imaging or workup regarding injury related to ligature. His injuries appear superficial. Lab work was placed and TTS consulted. Patient  meets inpatient criteria. They are requesting a bed.    Shon Baton, MD 02/28/15 1610  Shon Baton, MD 02/28/15 [id number]

## 2015-02-28 NOTE — ED Notes (Signed)
Pt sleeping soundly at this time.
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
## 2015-02-28 NOTE — ED Notes (Signed)
Pt found with power supply chord wrapped around his neck in attempt to commit suicide. [facility] attendant stated to EMS that pt was blue but conscious and talking when found. Pt has abrasions to neck.

## 2015-02-28 NOTE — ED Notes (Signed)
Pt still sound asleep.

## 2015-03-01 NOTE — ED Notes (Signed)
Olegario MessierKathy from [facility] called and stated patient is accepted at their facility to the IDD Unit. Called report to Youth workerCarol RN at [facility] at [phone]. Requested to call back to advise when patient leaves our facility.

## 2015-03-01 NOTE — ED Notes (Signed)
Pt given dinner tray.

## 2015-03-01 NOTE — Progress Notes (Signed)
Seeking inpatient psychiatric treatment for pt. 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   [facility]- per Christiane HaJonathan, send most recent vitals along with IQ score and it will be reviewed today. Will call back if more information is needed. [facility]- per Aggie Cosierheresa, did not receive referral last night- advised no beds at the moment but fax in case of discharges later. [facility] declined due to IQ of 6455- advised pt would not benefit from the facility's program  No other inpatient referral options at this time.  Spoke with pt's guardian Milda Smartammy Logan [phone] (previously listed as Vivianne Masterammy Hitchner). Ms. Whitney PostLogan states she is aware of pt's admission to ED and the plan of care. Expressed frustration in that, in the past, pt has been through admissions such as these and then "next thing I know he is discharged because he is not in crisis any more." Ms. Whitney PostLogan states she is aware that there are limited placement options for pt. Informed that pt will be re-evaluated by psychiatry as needed and that plan may change based on pt's symptoms and presentation. Given CSW and [facility] call back number for updates.  Ilean SkillMeghan Warden Buffa, MSW, LCSW Clinical Social Work, Disposition  03/01/2015 [phone]

## 2015-03-01 NOTE — ED Notes (Signed)
Attempted to call [facility] and update about patient. Left message on voicemail for someone to call back. Did not leave patient name or details on voicemail.

## 2015-03-01 NOTE — Progress Notes (Signed)
Received call from [place]- states pt is current consumer and [facility]. States she will be coming to ED to complete crisis assessment in order to assist with forming disposition as pt's case progresses. CSW provided call back number for follow up- Vance GatherShelley states she should arrive in ED around 4pm.   Ilean SkillMeghan Kaley Jutras, MSW, LCSW Clinical Social Work, Disposition  03/01/2015 [phone]

## 2015-03-01 NOTE — ED Notes (Signed)
Faxed [facility] Crisis Assessment to [place].

## 2015-03-01 NOTE — ED Notes (Signed)
Lunch tray given to pt.

## 2015-03-01 NOTE — Progress Notes (Signed)
[facility] at [facility] requested patient's Urine and drug screen. Clinical[facility]. Writer will continue to follow up with potential placement.  Melbourne Abtsatia Malyia Moro, LCSWA Disposition staff 03/01/2015 4:46 PM'

## 2015-03-01 NOTE — ED Provider Notes (Signed)
[facility] by Dr. Marcene Corninghatham.   [name] Boozeavid Jaydee Ingman, MD 03/01/15 2118

## 2015-03-01 NOTE — ED Notes (Signed)
[facility] [place]

## 2015-03-01 NOTE — ED Notes (Signed)
Officer at bedside to transport patient to [facility].

## 2015-03-01 NOTE — ED Notes (Signed)
Worker from patient's therapy called to check on patient and to see if they could provide any services to us, specifically a crisis assessment. I gave them Behavioral Health's number and informed her that they are coordinating his care/transfer to psychiatric facility and that they would be the one's to speak to about what is needed to assist them.

## 2015-03-02 NOTE — Progress Notes (Signed)
Was informed in shift report [facility] has been [facility].   Spoke with pt's guardian via phone- she is aware of transfer and in contact [facility].  Ilean SkillMeghan Telvin Reinders, MSW, LCSW Clinical Social Work, Disposition  03/02/2015 [phone]

## 2015-07-05 ENCOUNTER — Encounter (HOSPITAL_COMMUNITY): Payer: Self-pay

## 2015-07-05 ENCOUNTER — Emergency Department (HOSPITAL_COMMUNITY)
Admission: EM | Admit: 2015-07-05 | Discharge: 2015-07-05 | Disposition: A | Discharge status: Home / Self Care | Payer: Medicaid Other | Attending: Emergency Medicine | Admitting: Emergency Medicine

## 2015-07-05 ENCOUNTER — Emergency Department (HOSPITAL_COMMUNITY): Payer: Medicaid Other

## 2015-07-05 DIAGNOSIS — M25461 Effusion, right knee: Secondary | ICD-10-CM | POA: Insufficient documentation

## 2015-07-05 DIAGNOSIS — F17210 Nicotine dependence, cigarettes, uncomplicated: Secondary | ICD-10-CM | POA: Diagnosis not present

## 2015-07-05 DIAGNOSIS — I10 Essential (primary) hypertension: Secondary | ICD-10-CM | POA: Insufficient documentation

## 2015-07-05 DIAGNOSIS — M25561 Pain in right knee: Secondary | ICD-10-CM | POA: Diagnosis present

## 2015-07-05 DIAGNOSIS — Z79899 Other long term (current) drug therapy: Secondary | ICD-10-CM | POA: Insufficient documentation

## 2015-07-05 MED ORDER — HYDROCODONE-ACETAMINOPHEN 5-325 MG PO TABS: 1.0000 | ORAL_TABLET | ORAL | Status: AC | PRN

## 2015-07-05 NOTE — ED Provider Notes (Signed)
CSN: [id number]     Arrival date & time 07/05/15  1226 History  By signing my name below, I, [name] Larson, attest that this documentation has been prepared under the direction and in the presence of [name] Amor, PA-C. Electronically Signed: Tanda Larson, ED Scribe. 07/05/2015. 2:07 PM.   Chief Complaint  Patient presents with  . Knee Pain   Patient is a 35 y.o. male presenting with knee pain. The history is provided by the patient. No language interpreter was used.  Knee Pain Location:  Knee Time since incident:  0 days Injury: no   Knee location:  R knee Pain details:    Radiates to:  Does not radiate   Severity:  Moderate   Onset quality:  Gradual   Duration:  1 day   Timing:  Constant   Progression:  Unchanged Chronicity:  New Prior injury to area:  No Associated symptoms: no fever, no muscle weakness, no numbness and no tingling      HPI Comments: [name] Larson is a 35 y.o. male who presents to the Emergency Department complaining of gradual onset, constant, anterior right knee pain that began last night. Pt cannot recall what he was doing when he first noticed the pain. No known injury to the knee. The pain is exacerbated with bending of the knee. Pt mentions that a few weeks ago he twisted his knee and felt a popping sensation in the knee. He now intermittently hears a popping noise in his right knee. Denies weakness, numbness, tingling, or any other associated symptoms. Hx DVT in right leg. Pt is currently on Xarelto for the DVT.    Past Medical History  Diagnosis Date  . Hypertension   . DVT (deep venous thrombosis) (HCC)   . Renal disorder     kideny failure  . Anxiety    History reviewed. No pertinent past surgical history. History reviewed. No pertinent family history. Social History  Substance Use Topics  . Smoking status: Current Every Day Smoker -- 0.50 packs/day    Types: Cigarettes  . Smokeless tobacco: None  . Alcohol Use: No    Review of Systems   Constitutional: Negative for fever.  Musculoskeletal: Positive for arthralgias. Negative for myalgias.  Neurological: Negative for weakness and numbness.   Allergies  Lamotrigine; Clozaril; and Klonopin  Home Medications   Prior to Admission medications   Medication Sig Start Date End Date Taking? Authorizing Provider  gemfibrozil (LOPID) 600 MG tablet Take 600 mg by mouth 2 (two) times daily before a meal.    Historical Provider, MD  HYDROcodone-acetaminophen (NORCO/VICODIN) 5-325 MG tablet Take 1 tablet by mouth every 4 (four) hours as needed. 07/05/15   [name] Amor, PA-C  hydrOXYzine (VISTARIL) 25 MG capsule Take 25 mg by mouth daily.    Historical Provider, MD  lovastatin (MEVACOR) 40 MG tablet Take 40 mg by mouth at bedtime.    Historical Provider, MD  miconazole (ZEASORB-AF) 2 % powder Apply 1 application topically 2 (two) times daily. 11/18/13   Historical Provider, MD  mometasone (NASONEX) 50 MCG/ACT nasal spray Place 2 sprays into the nose daily.    Historical Provider, MD  omega-3 acid ethyl esters (LOVAZA) 1 G capsule Take 4 g by mouth daily.     Historical Provider, MD  paliperidone (INVEGA SUSTENNA) 234 MG/1.5ML SUSP injection Inject 234 mg into the muscle every 30 (thirty) days.     Historical Provider, MD  rivaroxaban (XARELTO) 20 MG TABS tablet Take 20 mg by mouth  daily.     Historical Provider, MD  sertraline (ZOLOFT) 50 MG tablet Take 50 mg by mouth daily.    Historical Provider, MD  sodium bicarbonate 650 MG tablet Take 650 mg by mouth 2 (two) times daily.    Historical Provider, MD   BP 137/86 mmHg  Pulse 99  Temp(Src) 98.2 F (36.8 C) (Temporal)  Resp 20  Ht 6\' 2"  (1.88 m)  Wt 140.615 kg  BMI 39.78 kg/m2  SpO2 98%   Physical Exam  Constitutional: He appears well-developed and well-nourished.  HENT:  Head: Atraumatic.  Neck: Normal range of motion.  Cardiovascular:  Pulses equal bilaterally  Musculoskeletal: He exhibits tenderness.  TTP along the right  anterior knee joint space and patellar ligament. Small effusion appreciated. No palpable deformity. No crepitus with knee flexion and extension. No ligamentous instability.  No palpable cords.  No calf edema or erythema.  No lymphangitis in the right leg.  Neurological: He is alert. He has normal strength. He displays normal reflexes. No sensory deficit.  Skin: Skin is warm and dry.  Psychiatric: He has a normal mood and affect.    ED Course  Procedures (including critical care time)  DIAGNOSTIC STUDIES: Oxygen Saturation is 98% on RA, normal by my interpretation.    COORDINATION OF CARE: 1:59 PM-Discussed treatment plan which includes Rx pain medication and follow up with orthopedist with pt at bedside and pt agreed to plan.   Labs Review Labs Reviewed - No data to display  Imaging Review Dg Knee Complete 4 Views Right  07/05/2015  CLINICAL DATA:  Right knee pain for 1 day.  No known injury. EXAM: RIGHT KNEE - COMPLETE 4+ VIEW COMPARISON:  Right lower leg radiographs 05/26/2009. FINDINGS: The mineralization and alignment are normal. There is no evidence of acute fracture or dislocation. The joint spaces are maintained. There is stable atypical fragmentation of the inferomedial patella. Lateral view demonstrates anterior soft tissue edema and a probable joint effusion. IMPRESSION: No acute osseous findings. Anterior soft tissue edema and probable joint effusion. Electronically Signed   By: [name] Larson  Veazey M.D.   On: 07/05/2015 12:53   I have personally reviewed and evaluated these images and lab results as part of my medical decision-making.   EKG Interpretation None      MDM   Final diagnoses:  Knee effusion, right   Ace wrap, crutches provided.  Pt prescribed hydrocodone. Advised activity as tolerated, referral to ortho for f/u care.  Pt has ROM in the joint, no erythema, no redness, doubt septic joint.  Pain is limited to the knee joint, do not think this is a complication of his  h/o dvt. Pt understands to call ortho for f/u care.  I personally performed the services described in this documentation, which was scribed in my presence. The recorded information has been reviewed and is accurate.     [name] AmorJulie Bashar Milam, PA-C 07/06/15 1722  Vanetta MuldersScott Zackowski, MD 07/12/15 [phone]

## 2015-07-05 NOTE — Discharge Instructions (Signed)
Knee Effusion Knee effusion means that you have excess fluid in your knee joint. This can cause pain and swelling in your knee. This may make your knee more difficult to bend and move. That is because there is increased pain and pressure in the joint. If there is fluid in your knee, it often means that something is wrong inside your knee, such as severe arthritis, abnormal inflammation, or an infection. Another common cause of knee effusion is an injury to the knee muscles, ligaments, or cartilage. HOME CARE INSTRUCTIONS  Use crutches as directed by your health care provider.  Wear a knee brace as directed by your health care provider.  Apply ice to the swollen area:  Put ice in a plastic bag.  Place a towel between your skin and the bag.  Leave the ice on for 20 minutes, 2-3 times per day.  Keep your knee raised (elevated) when you are sitting or lying down.  Take medicines only as directed by your health care provider.  Do any rehabilitation or strengthening exercises as directed by your health care provider.  Rest your knee as directed by your health care provider. You may start doing your normal activities again when your health care provider approves.   Keep all follow-up visits as directed by your health care provider. This is important. SEEK MEDICAL CARE IF:  You have ongoing (persistent) pain in your knee. SEEK IMMEDIATE MEDICAL CARE IF:  You have increased swelling or redness of your knee.  You have severe pain in your knee.  You have a fever.   This information is not intended to replace advice given to you by your health care provider. Make sure you discuss any questions you have with your health care provider.   Document Released: 05/20/2003 Document Revised: 03/20/2014 Document Reviewed: 10/13/2013 Elsevier Interactive Patient Education [employer]2016 [employer]   It is important for you to stay as active as you can.  Use the crutches to help minimize weight bearing  on the right.  Do not immobilize your legs as you need to stay active to help prevent another blood clot.   You may take the hydrocodone prescribed for pain relief.  This will make you drowsy - do not drive within 4 hours of taking this medication.

## 2015-07-05 NOTE — ED Notes (Signed)
Right knee pain X1 days. No injury per patient.

## 2015-07-05 NOTE — ED Notes (Signed)
MD at bedside. 

## 2015-07-12 ENCOUNTER — Encounter (HOSPITAL_COMMUNITY): Payer: Self-pay | Admitting: Emergency Medicine

## 2015-07-12 ENCOUNTER — Emergency Department (HOSPITAL_COMMUNITY)
Admission: EM | Admit: 2015-07-12 | Discharge: 2015-07-13 | Disposition: A | Discharge status: Home / Self Care | Payer: Medicaid Other | Attending: Emergency Medicine | Admitting: Emergency Medicine

## 2015-07-12 DIAGNOSIS — S40812A Abrasion of left upper arm, initial encounter: Secondary | ICD-10-CM | POA: Insufficient documentation

## 2015-07-12 DIAGNOSIS — F17210 Nicotine dependence, cigarettes, uncomplicated: Secondary | ICD-10-CM | POA: Diagnosis not present

## 2015-07-12 DIAGNOSIS — Y939 Activity, unspecified: Secondary | ICD-10-CM | POA: Insufficient documentation

## 2015-07-12 DIAGNOSIS — X838XXA Intentional self-harm by other specified means, initial encounter: Secondary | ICD-10-CM | POA: Insufficient documentation

## 2015-07-12 DIAGNOSIS — R443 Hallucinations, unspecified: Secondary | ICD-10-CM | POA: Insufficient documentation

## 2015-07-12 DIAGNOSIS — I10 Essential (primary) hypertension: Secondary | ICD-10-CM | POA: Diagnosis not present

## 2015-07-12 DIAGNOSIS — Y929 Unspecified place or not applicable: Secondary | ICD-10-CM | POA: Diagnosis not present

## 2015-07-12 DIAGNOSIS — S40811A Abrasion of right upper arm, initial encounter: Secondary | ICD-10-CM | POA: Diagnosis not present

## 2015-07-12 DIAGNOSIS — F259 Schizoaffective disorder, unspecified: Secondary | ICD-10-CM | POA: Diagnosis present

## 2015-07-12 DIAGNOSIS — Y999 Unspecified external cause status: Secondary | ICD-10-CM | POA: Diagnosis not present

## 2015-07-12 DIAGNOSIS — Z79899 Other long term (current) drug therapy: Secondary | ICD-10-CM | POA: Diagnosis not present

## 2015-07-12 DIAGNOSIS — Z7289 Other problems related to lifestyle: Secondary | ICD-10-CM

## 2015-07-12 DIAGNOSIS — IMO0002 Reserved for concepts with insufficient information to code with codable children: Secondary | ICD-10-CM

## 2015-07-12 HISTORY — DX: Neutropenia, unspecified: D70.9

## 2015-07-12 HISTORY — DX: Schizophrenia, unspecified: F20.9

## 2015-07-12 HISTORY — DX: Sleep apnea, unspecified: G47.30

## 2015-07-12 HISTORY — DX: Klinefelter syndrome, unspecified: Q98.4

## 2015-07-12 HISTORY — DX: Moderate intellectual disabilities: F71

## 2015-07-12 HISTORY — DX: Malignant neuroleptic syndrome: G21.0

## 2015-07-12 LAB — COMPREHENSIVE METABOLIC PANEL
ALT: 40 U/L (ref 17–63)
ANION GAP: 9 (ref 5–15)
AST: 37 U/L (ref 15–41)
Albumin: 4.1 g/dL (ref 3.5–5.0)
Alkaline Phosphatase: 90 U/L (ref 38–126)
BUN: 25 mg/dL — ABNORMAL HIGH (ref 6–20)
CHLORIDE: 108 mmol/L (ref 101–111)
CO2: 26 mmol/L (ref 22–32)
Calcium: 9.5 mg/dL (ref 8.9–10.3)
Creatinine, Ser: 1.5 mg/dL — ABNORMAL HIGH (ref 0.61–1.24)
GFR, EST NON AFRICAN AMERICAN: 59 mL/min — AB (ref >60)
Glucose, Bld: 114 mg/dL — ABNORMAL HIGH (ref 65–99)
POTASSIUM: 4.2 mmol/L (ref 3.5–5.1)
SODIUM: 143 mmol/L (ref 135–145)
Total Bilirubin: 0.9 mg/dL (ref 0.3–1.2)
Total Protein: 7.8 g/dL (ref 6.5–8.1)

## 2015-07-12 LAB — CBC WITH DIFFERENTIAL/PLATELET
Basophils Absolute: 0 K/uL (ref 0.0–0.1)
Basophils Relative: 0 %
Eosinophils Absolute: 0 K/uL (ref 0.0–0.7)
Eosinophils Relative: 1 %
HCT: 39.5 % (ref 39.0–52.0)
Hemoglobin: 13.2 g/dL (ref 13.0–17.0)
Lymphocytes Relative: 34 %
Lymphs Abs: 1.3 K/uL (ref 0.7–4.0)
MCH: 29 pg (ref 26.0–34.0)
MCHC: 33.4 g/dL (ref 30.0–36.0)
MCV: 86.8 fL (ref 78.0–100.0)
Monocytes Absolute: 0.4 K/uL (ref 0.1–1.0)
Monocytes Relative: 10 %
Neutro Abs: 2.1 K/uL (ref 1.7–7.7)
Neutrophils Relative %: 55 %
Platelets: 128 K/uL — ABNORMAL LOW (ref 150–400)
RBC: 4.55 MIL/uL (ref 4.22–5.81)
RDW: 14.5 % (ref 11.5–15.5)
WBC: 3.8 K/uL — ABNORMAL LOW (ref 4.0–10.5)

## 2015-07-12 LAB — RAPID URINE DRUG SCREEN, HOSP PERFORMED
Amphetamines: NOT DETECTED
Barbiturates: NOT DETECTED
Benzodiazepines: NOT DETECTED
Cocaine: NOT DETECTED
Opiates: NOT DETECTED
Tetrahydrocannabinol: NOT DETECTED

## 2015-07-12 LAB — ETHANOL: Alcohol, Ethyl (B): <5 mg/dL

## 2015-07-12 MED ORDER — ACETAMINOPHEN 325 MG PO TABS: 650.0000 mg | ORAL_TABLET | ORAL | Status: DC | PRN

## 2015-07-12 MED ORDER — ONDANSETRON HCL 4 MG PO TABS: 4.0000 mg | ORAL_TABLET | Freq: Three times a day (TID) | ORAL | Status: DC | PRN

## 2015-07-12 MED ORDER — IBUPROFEN 400 MG PO TABS: 600.0000 mg | ORAL_TABLET | Freq: Three times a day (TID) | ORAL | Status: DC | PRN

## 2015-07-12 MED ORDER — ZOLPIDEM TARTRATE 5 MG PO TABS: 5.0000 mg | ORAL_TABLET | Freq: Every evening | ORAL | Status: DC | PRN

## 2015-07-12 MED ORDER — NICOTINE 21 MG/24HR TD PT24
21.0000 mg | MEDICATED_PATCH | Freq: Every day | TRANSDERMAL | Status: DC
  Administered 2015-07-12 – 2015-07-13 (×2): 21 mg via TRANSDERMAL
  Filled 2015-07-12 (×2): qty 1

## 2015-07-12 NOTE — ED Notes (Addendum)
Pt is from [facility]. Pt reports hearing voices telling him to runaway and to harm himself. Pt states this has happened before. Pt accompanied by caretaker. Caretaker states pt eloped from group home last Wed. Pt noted to have scratch marks to RT hand and up entire forearm. Pt calm and cooperative at this time. States he has been taking all of his medications.

## 2015-07-12 NOTE — ED Notes (Signed)
Pt wanded by security. 

## 2015-07-12 NOTE — ED Provider Notes (Addendum)
CSN: [id number]     Arrival date & time 07/12/15  1032 History  By signing my name below, I, Freida Busman, attest that this documentation has been prepared under the direction and in the presence of Eber Hong, MD . Electronically Signed: Freida Busman, Scribe. 07/12/2015. 12:17 PM.    Chief Complaint  Patient presents with  . V70.1    The history is provided by the patient. No language interpreter was used.   HPI Comments:  [name] Larson is a 35 y.o. male who presents to the Emergency Department complaining of auditory hallucinations since last night. Pt states the voice told him to scatch up his BUE and he complied, reports abrasions to his BUE. He states the voice also told him to steal cars, harm himself and run away from the group home. Pt has listened to the voices in the past; he has attempted to kill himself by wrapping a cord around his neck per caregiver. Caregiver also notes pt ran away from the group home for a few hours last week. Pt denies SI but notes he does what the voices tell him. He also denies ETOH consumption; states he is a smoker. No alleviating factors noted.  Past Medical History  Diagnosis Date  . Hypertension   . DVT (deep venous thrombosis) (HCC)   . Renal disorder     kideny failure  . Anxiety   . Schizophrenia (HCC)   . Sleep apnea   . Moderate intellectual disability   . Klinefelter syndrome   . Neuroleptic malignant syndrome   . Neutropenia (HCC)    History reviewed. No pertinent past surgical history. No family history on file. Social History  Substance Use Topics  . Smoking status: Current Every Day Smoker -- 0.50 packs/day    Types: Cigarettes  . Smokeless tobacco: None  . Alcohol Use: No    Review of Systems  Respiratory: Negative for shortness of breath.   Cardiovascular: Negative for chest pain.  Gastrointestinal: Negative for abdominal pain.  Skin: Positive for wound.  Psychiatric/Behavioral: Positive for hallucinations.    Allergies   Lamotrigine; Clozaril; and Klonopin  Home Medications   Prior to Admission medications   Medication Sig Start Date End Date Taking? Authorizing Provider  gemfibrozil (LOPID) 600 MG tablet Take 600 mg by mouth 2 (two) times daily before a meal.   Yes Historical Provider, MD  hydrOXYzine (VISTARIL) 25 MG capsule Take 25 mg by mouth daily.   Yes Historical Provider, MD  lovastatin (MEVACOR) 40 MG tablet Take 40 mg by mouth at bedtime.   Yes Historical Provider, MD  miconazole (ZEASORB-AF) 2 % powder Apply 1 application topically 2 (two) times daily. 11/18/13  Yes Historical Provider, MD  mometasone (NASONEX) 50 MCG/ACT nasal spray Place 2 sprays into the nose daily.   Yes Historical Provider, MD  omega-3 acid ethyl esters (LOVAZA) 1 G capsule Take 4 g by mouth daily.    Yes Historical Provider, MD  paliperidone (INVEGA SUSTENNA) 234 MG/1.5ML SUSP injection Inject 234 mg into the muscle every 30 (thirty) days.    Yes Historical Provider, MD  rivaroxaban (XARELTO) 20 MG TABS tablet Take 20 mg by mouth daily.    Yes Historical Provider, MD  sertraline (ZOLOFT) 50 MG tablet Take 50 mg by mouth daily.   Yes Historical Provider, MD  sodium bicarbonate 650 MG tablet Take 650 mg by mouth 2 (two) times daily.   Yes Historical Provider, MD  HYDROcodone-acetaminophen (NORCO/VICODIN) 5-325 MG tablet Take 1 tablet  by mouth every 4 (four) hours as needed. Patient not taking: Reported on 07/12/2015 07/05/15   Burgess AmorJulie Idol, PA-C   BP 106/67 mmHg  Pulse 80  Temp(Src) 98.4 F (36.9 C) (Oral)  Resp 19  Ht 6\' 3"  (1.905 m)  Wt 269 lb 14.4 oz (122.426 kg)  BMI 33.74 kg/m2  SpO2 97% Physical Exam  Constitutional: He appears well-developed and well-nourished. No distress.  HENT:  Head: Normocephalic and atraumatic.  Mouth/Throat: Oropharynx is clear and moist. No oropharyngeal exudate.  Eyes: Conjunctivae and EOM are normal. Pupils are equal, round, and reactive to light. Right eye exhibits no discharge. Left eye  exhibits no discharge. No scleral icterus.  Neck: Normal range of motion. Neck supple. No JVD present. No thyromegaly present.  Cardiovascular: Normal rate, regular rhythm, normal heart sounds and intact distal pulses.  Exam reveals no gallop and no friction rub.   No murmur heard. Pulmonary/Chest: Effort normal and breath sounds normal. No respiratory distress. He has no wheezes. He has no rales.  Abdominal: Soft. Bowel sounds are normal. He exhibits no distension and no mass. There is no tenderness.  Musculoskeletal: Normal range of motion. He exhibits no edema or tenderness.  Lymphadenopathy:    He has no cervical adenopathy.  Neurological: He is alert. Coordination normal.  Skin: Skin is warm and dry.  Multiple superficial abrasions to right forearm; no lacerations 3 abrasions to dorsum of the right hand.  several superficial abrasion to left forearm as well   Psychiatric: He has a normal mood and affect. His behavior is normal.  Nursing note and vitals reviewed.   ED Course  Procedures  DIAGNOSTIC STUDIES:  Oxygen Saturation is 99% on RA, normal by my interpretation.    COORDINATION OF CARE:  11:03 AM Discussed treatment plan with pt at bedside and pt agreed to plan.  Labs Review Labs Reviewed  COMPREHENSIVE METABOLIC PANEL - Abnormal; Notable for the following:    Glucose, Bld 114 (*)    BUN 25 (*)    Creatinine, Ser 1.50 (*)    GFR calc non Af Amer 59 (*)    All other components within normal limits  CBC WITH DIFFERENTIAL/PLATELET - Abnormal; Notable for the following:    WBC 3.8 (*)    Platelets 128 (*)    All other components within normal limits  ETHANOL  URINE RAPID DRUG SCREEN, HOSP PERFORMED    Imaging Review No results found. I have personally reviewed and evaluated these images and lab results as part of my medical decision-making.   Meds given in ED:  Medications  ondansetron (ZOFRAN) tablet 4 mg (not administered)  nicotine (NICODERM CQ - dosed in  mg/24 hours) patch 21 mg (21 mg Transdermal Patch Applied 07/13/15 1028)  zolpidem (AMBIEN) tablet 5 mg (not administered)  ibuprofen (ADVIL,MOTRIN) tablet 600 mg (not administered)  acetaminophen (TYLENOL) tablet 650 mg (not administered)    New Prescriptions   No medications on file      MDM   Final diagnoses:  Self-inflicted injury  Hallucinations    I personally performed the services described in this documentation, which was scribed in my presence. The recorded information has been reviewed and is accurate.    The pt has been seen by TTS - they request repeat exam in AM in 24 hours by TTS as the pt is denying abnormal thoughts or SI at this time.   They recommend against med changes per their phone call with me.  On 07/13/15, the pt  was seen again by TTS and now he is happy, not hallucinating and wants to go home - he has not been suicidal the entire time 0- TTS has cleared him for d/c back to group home.   Eber Hong, MD 07/12/15 1610  Eber Hong, MD 07/13/15 [phone]

## 2015-07-12 NOTE — BH Specialist Note (Signed)
Per Vernona RiegerLaura, NP - patient will remain in the ED overnight.  Patient will be re-assessed in the morning. Writer informed the ER MD of the disposition.

## 2015-07-12 NOTE — BH Assessment (Addendum)
Tele Assessment Note   Patient is a 35 year old male that denies SI/HI/Psychosis/Substance Abuse.  Patient was brought to the ED by the staff at [facility].  Per staff, the patient has been living at the facility for one year.   Patient reports that he was feeling suicidal yesterday.  Patient reports that he was hearing voices telling him to run away and to harm himself.   Patient reports that the voices are not telling him to harm himself right now.    Patient reports that he has a diagnosis of Schizophrenia.  Patient reports a history of psychiatric hospitalizations.  Patient reports that his last hospitalization was three months ago.  Patient reports compliance with taking his medication.    Per, [facility] staff the patient eloped from the group home last Wed.  During the assessment the patient showed me scratch marks to RT hand and up entire forearm.   Patient reports that the voices told him to break a spoon and use the sharp side to scratch his arm.   Pt calm and cooperative at this time. States he has been taking all of his medications.  Per, [facility] staff the patient has listened to the voices in the past; he has attempted to kill himself by wrapping a cord around his neck per caregiver.   Diagnosis: Schizophrenia   Past Medical History:  Past Medical History  Diagnosis Date  . Hypertension   . DVT (deep venous thrombosis) (HCC)   . Renal disorder     kideny failure  . Anxiety   . Schizophrenia (HCC)   . Sleep apnea   . Moderate intellectual disability   . Klinefelter syndrome   . Neuroleptic malignant syndrome   . Neutropenia (HCC)     History reviewed. No pertinent past surgical history.  Family History: No family history on file.  Social History:  reports that he has been smoking Cigarettes.  He has been smoking about 0.50 packs per day. He does not have any smokeless tobacco history on file. He reports that he does not drink alcohol or use  illicit drugs.  Additional Social History:  Alcohol / Drug Use History of alcohol / drug use?: No history of alcohol / drug abuse  CIWA: CIWA-Ar BP: 107/68 mmHg Pulse Rate: 67 COWS:    PATIENT STRENGTHS: (choose at least two) Average or above average intelligence Physical Health  Allergies:  Allergies  Allergen Reactions  . Lamotrigine Shortness Of Breath and Nausea And Vomiting    "I was about to die"  . Clozaril [Clozapine] Other (See Comments)  . Klonopin [Clonazepam] Other (See Comments)    Reaction unknown. From a long time ago.    Home Medications:  (Not in a hospital admission)  OB/GYN Status:  No LMP for male patient.  General Assessment Data Location of Assessment: [facility] TTS Assessment: In system Is this a Tele or Face-to-Face Assessment?: Tele Assessment Is this an Initial Assessment or a Re-assessment for this encounter?: Initial Assessment Marital status: Single Maiden name: NA Is patient pregnant?: No Pregnancy Status: No Living Arrangements: Group Home Can pt return to current living arrangement?: Yes Admission Status: Voluntary Is patient capable of signing voluntary admission?: Yes Referral Source: Self/Family/Friend Insurance type: Medicaid     Crisis Care Plan Living Arrangements: Group Home Legal Guardian:  (NA) Name of Psychiatrist: Dr. Jinny Sanders Name of Therapist: Therapist located at the [facility]  (Patient was not able to rememer the name)  Education Status Is patient currently in school?: No Current Grade: NA Highest grade of school patient has completed: NA Name of school: NA Contact person: NA  Risk to self with the past 6 months Suicidal Ideation: Yes-Currently Present Has patient been a risk to self within the past 6 months prior to admission? : Yes Suicidal Intent: Yes-Currently Present Has patient had any suicidal intent within the past 6 months prior to admission? : No Is patient at risk for suicide?: No Suicidal  Plan?: No Has patient had any suicidal plan within the past 6 months prior to admission? : No Access to Means: No What has been your use of drugs/alcohol within the last 12 months?: na Previous Attempts/Gestures: Yes How many times?: 4 Other Self Harm Risks: None Reported Triggers for Past Attempts: Hallucinations Intentional Self Injurious Behavior: Cutting Comment - Self Injurious Behavior: Cutting  Family Suicide History: No Recent stressful life event(s):  (Psychosis) Persecutory voices/beliefs?: Yes Depression: Yes Depression Symptoms: Loss of interest in usual pleasures Substance abuse history and/or treatment for substance abuse?: No Suicide prevention information given to non-admitted patients: Yes  Risk to Others within the past 6 months Homicidal Ideation: No Does patient have any lifetime risk of violence toward others beyond the six months prior to admission? : No Thoughts of Harm to Others: No Current Homicidal Intent: No Current Homicidal Plan: No Access to Homicidal Means: No Identified Victim: None  History of harm to others?: No Assessment of Violence: None Noted Violent Behavior Description: n Does patient have access to weapons?: No Criminal Charges Pending?: No Does patient have a court date: No Is patient on probation?: No  Psychosis Hallucinations: Visual, With command Delusions: None noted  Mental Status Report Appearance/Hygiene: In scrubs Eye Contact: Fair Motor Activity: Freedom of movement Speech: Logical/coherent Level of Consciousness: Restless Mood: Depressed, Suspicious Affect: Anxious Anxiety Level: Minimal Thought Processes: Relevant, Coherent Judgement: Unimpaired Orientation: Place, Person, Time, Situation Obsessive Compulsive Thoughts/Behaviors: None  Cognitive Functioning Concentration: Good Memory: Recent Intact, Remote Intact IQ: Average Insight: Fair Impulse Control: Fair Appetite: Fair Weight Loss: 0 Weight Gain:  0 Sleep: Decreased Total Hours of Sleep: 5 Vegetative Symptoms: None  ADLScreening [facility]) Patient's cognitive ability adequate to safely complete daily activities?: Yes Patient able to express need for assistance with ADLs?: No Independently performs ADLs?: Yes (appropriate for developmental age)  Prior Inpatient Therapy Prior Inpatient Therapy: Yes Prior Therapy Dates: Feb 2017 Prior Therapy Facilty/Provider(s): Unable to remember the name Reason for Treatment: SI/Psychosis  Prior Outpatient Therapy Prior Outpatient Therapy: Yes Prior Therapy Dates: Ongoing  Prior Therapy Facilty/Provider(s): Dr. Jinny Sanders Reason for Treatment: Medication Management  Does patient have an ACCT team?: Yes Does patient have Intensive In-House Services?  : No Does patient have [facility] services? : No Does patient have P4CC services?: No  ADL Screening (condition at time of admission) Patient's cognitive ability adequate to safely complete daily activities?: Yes Is the patient deaf or have difficulty hearing?: Yes Does the patient have difficulty seeing, even when wearing glasses/contacts?: No Does the patient have difficulty concentrating, remembering, or making decisions?: Yes Patient able to express need for assistance with ADLs?: No Does the patient have difficulty dressing or bathing?: No Independently performs ADLs?: Yes (appropriate for developmental age) Does the patient have difficulty walking or climbing stairs?: No Weakness of Legs: None Weakness of Arms/Hands: None  Home Assistive Devices/Equipment Home Assistive Devices/Equipment: None    Abuse/Neglect Assessment (Assessment to be complete while patient is alone) Physical Abuse: Denies  Verbal Abuse: Denies Sexual Abuse: Denies Exploitation of patient/patient's resources: Denies Self-Neglect: Denies Values / Beliefs Cultural Requests During Hospitalization: None Spiritual Requests During Hospitalization:  None Consults Spiritual Care Consult Needed: No Social Work Consult Needed: No Merchant navy officerAdvance Directives (For Healthcare) Does patient have an advance directive?: No Would patient like information on creating an advanced directive?: No - patient declined information    Additional Information 1:1 In Past 12 Months?: No CIRT Risk: No Elopement Risk: No Does patient have medical clearance?: Yes     Disposition: Per Vernona RiegerLaura, NP - patient will remain in the ED overnight. Patient will be re-assessed in the morning. Writer informed the ER MD of the disposition Disposition Initial Assessment Completed for this Encounter: Yes Disposition of Patient: Other dispositions  Linton RumpStevenson, Nayelly Laughman [place] 07/12/2015 12:45 PM

## 2015-07-13 DIAGNOSIS — F251 Schizoaffective disorder, depressive type: Secondary | ICD-10-CM

## 2015-07-13 NOTE — Discharge Instructions (Signed)
Return to the ER if you have worsening hallucinations Or Self injury thoughts / behaviour.

## 2015-07-13 NOTE — Consult Note (Signed)
Telepsych Consultation   Reason for Consult: Psychosis/Suicidal Ideation  Referring Physician: EDP Patient Identification: [name] Larson MRN:  [id number] Principal Diagnosis: Schizoaffective disorder [facility]) Diagnosis:   Patient Active Problem List   Diagnosis Date Noted  . MR (mental retardation), moderate [F71] 08/05/2013  . Conduct disorder [F91.9] 08/05/2013  . Schizoaffective disorder ([place]) [F25.9] 01/30/2007  . BIPOLAR DISORDER UNSPECIFIED [F31.9] 01/30/2007  . DEPRESSION [F32.9] 01/30/2007  . 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 NEUROLEPTIC MALIGNANT SYNDROME [G21.0] 01/30/2007  . ASTHMA [J45.909] 01/30/2007  . 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 SLEEP APNEA [G47.30] 01/30/2007    Total Time spent with patient: 30 minutes  Subjective:   [name] Larson is a 35 y.o. male patient admitted with psychosis from his group home. Patient stated "I was hearing voices on Sunday. They would not go away that day. They were telling me to hurt myself and I did scratch. I'm feeling much better today. I want to go back. I think the Lorayne Bender is helping me more now."  HPI:    [name] Larson is a 35 year old male who was brought to the ED by the staff at his group home. The day before the patient reported feeling suicidal and that he was hearing voices telling him to run away. Today during interview patient denies that he is hearing any voices or experiencing suicidal ideation. He is not sure why the symptoms are better but appears relieved. Patient is very calm and cooperative during the assessment. There is not evidence of any acute psychosis noted. [name] denies suicidal ideation during the assessment and requests discharge back to the group home. Staff at [facility] denied any safety concerns reporting that the group home will take him back. Patient to continue to follow up with his outpatient psychiatrist. The patient was encouraged to speak with his Psychiatrist regarding advice on when the  voices are difficult for him to manage in order to minimize future ED visits.   Past Psychiatric History: Schizoaffective   Risk to Self: Suicidal Ideation: Yes-Currently Present Suicidal Intent: Yes-Currently Present Is patient at risk for suicide?: No Suicidal Plan?: No Access to Means: No What has been your use of drugs/alcohol within the last 12 months?: na How many times?: 4 Other Self Harm Risks: None Reported Triggers for Past Attempts: Hallucinations Intentional Self Injurious Behavior: Cutting Comment - Self Injurious Behavior: Cutting  Risk to Others: Homicidal Ideation: No Thoughts of Harm to Others: No Current Homicidal Intent: No Current Homicidal Plan: No Access to Homicidal Means: No Identified Victim: None  History of harm to others?: No Assessment of Violence: None Noted Violent Behavior Description: n Does patient have access to weapons?: No Criminal Charges Pending?: No Does patient have a court date: No Prior Inpatient Therapy: Prior Inpatient Therapy: Yes Prior Therapy Dates: Feb 2017 Prior Therapy Facilty/Provider(s): Unable to remember the name Reason for Treatment: SI/Psychosis Prior Outpatient Therapy: Prior Outpatient Therapy: Yes Prior Therapy Dates: Ongoing  Prior Therapy Facilty/Provider(s): Dr. Ma Rings Reason for Treatment: Medication Management  Does patient have an ACCT team?: Yes Does patient have Intensive In-House Services?  : No Does patient have [facility] services? : No Does patient have P4CC services?: No  Past Medical History:  Past Medical History  Diagnosis Date  . Hypertension   . DVT (deep venous thrombosis) ([place])   . Renal disorder     kideny failure  . Anxiety   . Schizophrenia ([place])   . Sleep apnea   . Moderate intellectual disability   .  Klinefelter syndrome   . Neuroleptic malignant syndrome   . Neutropenia ([place])    History reviewed. No pertinent past surgical history. Family History:  Social History:  History   Alcohol Use No     History  Drug Use No    Social History   Social History  . Marital Status: Single    Spouse Name: N/A  . Number of Children: N/A  . Years of Education: N/A   Social History Main Topics  . Smoking status: Current Every Day Smoker -- 0.50 packs/day    Types: Cigarettes  . Smokeless tobacco: None  . Alcohol Use: No  . Drug Use: No  . Sexual Activity: Not Asked   Other Topics Concern  . None   Social History Narrative   Additional Social History:    Allergies:   Allergies  Allergen Reactions  . Lamotrigine Shortness Of Breath and Nausea And Vomiting    "I was about to die"  . Clozaril [Clozapine] Other (See Comments)  . Klonopin [Clonazepam] Other (See Comments)    Reaction unknown. From a long time ago.    Labs:  Results for orders placed or performed during the hospital encounter of 07/12/15 (from the past 48 hour(s))  Urine rapid drug screen (hosp performed)not at [facility]     Status: None   Collection Time: 07/12/15 11:44 AM  Result Value Ref Range   Opiates NONE DETECTED NONE DETECTED   Cocaine NONE DETECTED NONE DETECTED   Benzodiazepines NONE DETECTED NONE DETECTED   Amphetamines NONE DETECTED NONE DETECTED   Tetrahydrocannabinol NONE DETECTED NONE DETECTED   Barbiturates NONE DETECTED NONE DETECTED    Comment:        DRUG SCREEN FOR MEDICAL PURPOSES ONLY.  IF CONFIRMATION IS NEEDED FOR ANY PURPOSE, NOTIFY LAB WITHIN 5 DAYS.        LOWEST DETECTABLE LIMITS FOR URINE DRUG SCREEN Drug Class       Cutoff (ng/mL) Amphetamine      1000 Barbiturate      200 Benzodiazepine   536 Tricyclics       644 Opiates          300 Cocaine          300 THC              50   Comprehensive metabolic panel     Status: Abnormal   Collection Time: 07/12/15 12:05 PM  Result Value Ref Range   Sodium 143 135 - 145 mmol/L   Potassium 4.2 3.5 - 5.1 mmol/L   Chloride 108 101 - 111 mmol/L   CO2 26 22 - 32 mmol/L   Glucose, Bld 114 (H) 65 - 99 mg/dL    BUN 25 (H) 6 - 20 mg/dL   Creatinine, Ser 1.50 (H) 0.61 - 1.24 mg/dL   Calcium 9.5 8.9 - 10.3 mg/dL   Total Protein 7.8 6.5 - 8.1 g/dL   Albumin 4.1 3.5 - 5.0 g/dL   AST 37 15 - 41 U/L   ALT 40 17 - 63 U/L   Alkaline Phosphatase 90 38 - 126 U/L   Total Bilirubin 0.9 0.3 - 1.2 mg/dL   GFR calc non Af Amer 59 (L) >60 mL/min   GFR calc Af Amer >60 >60 mL/min    Comment: (NOTE) The eGFR has been calculated using the CKD EPI equation. This calculation has not been validated in all clinical situations. eGFR's persistently <60 mL/min signify possible Chronic Kidney Disease.    Anion gap  9 5 - 15  Ethanol     Status: None   Collection Time: 07/12/15 12:05 PM  Result Value Ref Range   Alcohol, Ethyl (B) <5 <5 mg/dL    Comment:        LOWEST DETECTABLE LIMIT FOR SERUM ALCOHOL IS 5 mg/dL FOR MEDICAL PURPOSES ONLY   CBC with Diff     Status: Abnormal   Collection Time: 07/12/15 12:05 PM  Result Value Ref Range   WBC 3.8 (L) 4.0 - 10.5 K/uL   RBC 4.55 4.22 - 5.81 MIL/uL   Hemoglobin 13.2 13.0 - 17.0 g/dL   HCT 39.5 39.0 - 52.0 %   MCV 86.8 78.0 - 100.0 fL   MCH 29.0 26.0 - 34.0 pg   MCHC 33.4 30.0 - 36.0 g/dL   RDW 14.5 11.5 - 15.5 %   Platelets 128 (L) 150 - 400 K/uL   Neutrophils Relative % 55 %   Neutro Abs 2.1 1.7 - 7.7 K/uL   Lymphocytes Relative 34 %   Lymphs Abs 1.3 0.7 - 4.0 K/uL   Monocytes Relative 10 %   Monocytes Absolute 0.4 0.1 - 1.0 K/uL   Eosinophils Relative 1 %   Eosinophils Absolute 0.0 0.0 - 0.7 K/uL   Basophils Relative 0 %   Basophils Absolute 0.0 0.0 - 0.1 K/uL    Current Facility-Administered Medications  Medication Dose Route Frequency Provider Last Rate Last Dose  . acetaminophen (TYLENOL) tablet 650 mg  650 mg Oral Q4H PRN Noemi Chapel, MD      . ibuprofen (ADVIL,MOTRIN) tablet 600 mg  600 mg Oral Q8H PRN Noemi Chapel, MD      . nicotine (NICODERM CQ - dosed in mg/24 hours) patch 21 mg  21 mg Transdermal Daily Noemi Chapel, MD   21 mg at 07/13/15  1028  . ondansetron (ZOFRAN) tablet 4 mg  4 mg Oral Q8H PRN Noemi Chapel, MD      . zolpidem (AMBIEN) tablet 5 mg  5 mg Oral QHS PRN Noemi Chapel, MD       Current Outpatient Prescriptions  Medication Sig Dispense Refill  . gemfibrozil (LOPID) 600 MG tablet Take 600 mg by mouth 2 (two) times daily before a meal.    . hydrOXYzine (VISTARIL) 25 MG capsule Take 25 mg by mouth daily.    Marland Kitchen lovastatin (MEVACOR) 40 MG tablet Take 40 mg by mouth at bedtime.    . miconazole (ZEASORB-AF) 2 % powder Apply 1 application topically 2 (two) times daily.    . mometasone (NASONEX) 50 MCG/ACT nasal spray Place 2 sprays into the nose daily.    Marland Kitchen omega-3 acid ethyl esters (LOVAZA) 1 G capsule Take 4 g by mouth daily.     . paliperidone (INVEGA SUSTENNA) 234 MG/1.5ML SUSP injection Inject 234 mg into the muscle every 30 (thirty) days.     . rivaroxaban (XARELTO) 20 MG TABS tablet Take 20 mg by mouth daily.     . sertraline (ZOLOFT) 50 MG tablet Take 50 mg by mouth daily.    . sodium bicarbonate 650 MG tablet Take 650 mg by mouth 2 (two) times daily.    Marland Kitchen HYDROcodone-acetaminophen (NORCO/VICODIN) 5-325 MG tablet Take 1 tablet by mouth every 4 (four) hours as needed. (Patient not taking: Reported on 07/12/2015) 10 tablet 0    Musculoskeletal:  Unable to assess via camera   Psychiatric Specialty Exam: Review of Systems  Constitutional: Negative.   HENT: Negative.   Eyes: Negative.   Respiratory:  Negative.   Cardiovascular: Negative.   Gastrointestinal: Negative.   Genitourinary: Negative.   Musculoskeletal: Negative.   Skin: Negative.   Neurological: Negative.   Endo/Heme/Allergies: Negative.   Psychiatric/Behavioral: Negative for depression, suicidal ideas, hallucinations, memory loss and substance abuse. The patient is not nervous/anxious and does not have insomnia.     Blood pressure 106/67, pulse 80, temperature 98.4 F (36.9 C), temperature source Oral, resp. rate 19, height 6' 3" (1.905 m), weight  122.426 kg (269 lb 14.4 oz), SpO2 97 %.Body mass index is 33.74 kg/(m^2).  General Appearance: Casual  Eye Contact::  Good  Speech:  Clear and Coherent and Slow  Volume:  Normal  Mood:  Anxious  Affect:  Appropriate  Thought Process:  Goal Directed and Intact  Orientation:  Full (Time, Place, and Person)  Thought Content:  Recent psychiatric symptoms   Suicidal Thoughts:  No  Homicidal Thoughts:  No  Memory:  Immediate;   Fair Recent;   Fair Remote;   Fair  Judgement:  Fair  Insight:  Shallow  Psychomotor Activity:  Normal  Concentration:  Good  Recall:  [place] of Knowledge:Good  Language: Good  Akathisia:  No  Handed:  Right  AIMS (if indicated):     Assets:  Communication Skills Desire for Improvement Housing Leisure Time Physical Health Resilience Social Support  ADL's:  Intact  Cognition: Impaired,  Mild  Sleep:      Treatment Plan Summary: Discussed case with Dr. Sabra Heck who agrees that patient is stable to discharge back to the group home. Spoke with staff from [facility] who deny safety concerns.   Disposition: No evidence of imminent risk to self or others at present.   Patient does not meet criteria for psychiatric inpatient admission. Supportive therapy provided about ongoing stressors.  Elmarie Shiley, NP 07/13/2015 11:57 AM       I have been consulted about this patient and agree with the assessment and plan Geralyn Flash A. [place].D.

## 2015-07-13 NOTE — ED Notes (Signed)
Telepsych in progress. 

## 2015-07-13 NOTE — ED Notes (Signed)
Pt request to use phone to call his group home. Nurse spoke with staff at group and  request them to fax psychological [facility]

## 2015-07-13 NOTE — ED Notes (Signed)
Pt sleeping, head covered with blanket; pt lying on left side

## 2015-07-13 NOTE — ED Notes (Signed)
RN spoke with Jeannett SeniorStephen from group home facility [phone]. States he will pick up pt in approximately 30 minutes.

## 2015-07-13 NOTE — ED Notes (Signed)
Patient asleep at this time. No distress noted. Equal rise and fall of chest. Even respirations. Sitter at bedside.

## 2015-08-31 ENCOUNTER — Emergency Department (HOSPITAL_COMMUNITY)
Admission: EM | Admit: 2015-08-31 | Discharge: 2015-09-02 | Disposition: A | Discharge status: Home / Self Care | Payer: Medicaid Other | Attending: Emergency Medicine | Admitting: Emergency Medicine

## 2015-08-31 ENCOUNTER — Encounter (HOSPITAL_COMMUNITY): Payer: Self-pay | Admitting: Emergency Medicine

## 2015-08-31 DIAGNOSIS — F209 Schizophrenia, unspecified: Secondary | ICD-10-CM | POA: Diagnosis not present

## 2015-08-31 DIAGNOSIS — S50811A Abrasion of right forearm, initial encounter: Secondary | ICD-10-CM | POA: Insufficient documentation

## 2015-08-31 DIAGNOSIS — Y999 Unspecified external cause status: Secondary | ICD-10-CM | POA: Insufficient documentation

## 2015-08-31 DIAGNOSIS — F259 Schizoaffective disorder, unspecified: Secondary | ICD-10-CM | POA: Diagnosis present

## 2015-08-31 DIAGNOSIS — IMO0002 Reserved for concepts with insufficient information to code with codable children: Secondary | ICD-10-CM

## 2015-08-31 DIAGNOSIS — Y939 Activity, unspecified: Secondary | ICD-10-CM | POA: Insufficient documentation

## 2015-08-31 DIAGNOSIS — R45851 Suicidal ideations: Secondary | ICD-10-CM | POA: Insufficient documentation

## 2015-08-31 DIAGNOSIS — Z7289 Other problems related to lifestyle: Secondary | ICD-10-CM

## 2015-08-31 DIAGNOSIS — Y92009 Unspecified place in unspecified non-institutional (private) residence as the place of occurrence of the external cause: Secondary | ICD-10-CM | POA: Insufficient documentation

## 2015-08-31 DIAGNOSIS — I10 Essential (primary) hypertension: Secondary | ICD-10-CM | POA: Insufficient documentation

## 2015-08-31 DIAGNOSIS — F329 Major depressive disorder, single episode, unspecified: Secondary | ICD-10-CM | POA: Diagnosis not present

## 2015-08-31 DIAGNOSIS — F17210 Nicotine dependence, cigarettes, uncomplicated: Secondary | ICD-10-CM | POA: Diagnosis not present

## 2015-08-31 DIAGNOSIS — X838XXA Intentional self-harm by other specified means, initial encounter: Secondary | ICD-10-CM | POA: Diagnosis not present

## 2015-08-31 DIAGNOSIS — Z008 Encounter for other general examination: Secondary | ICD-10-CM | POA: Diagnosis present

## 2015-08-31 DIAGNOSIS — F71 Moderate intellectual disabilities: Secondary | ICD-10-CM | POA: Diagnosis present

## 2015-08-31 DIAGNOSIS — F32A Depression, unspecified: Secondary | ICD-10-CM

## 2015-08-31 LAB — COMPREHENSIVE METABOLIC PANEL
ALBUMIN: 4.1 g/dL (ref 3.5–5.0)
ALT: 42 U/L (ref 17–63)
ANION GAP: 7 (ref 5–15)
AST: 43 U/L — ABNORMAL HIGH (ref 15–41)
Alkaline Phosphatase: 98 U/L (ref 38–126)
BILIRUBIN TOTAL: 0.9 mg/dL (ref 0.3–1.2)
BUN: 25 mg/dL — ABNORMAL HIGH (ref 6–20)
CO2: 25 mmol/L (ref 22–32)
Calcium: 9.3 mg/dL (ref 8.9–10.3)
Chloride: 108 mmol/L (ref 101–111)
Creatinine, Ser: 1.56 mg/dL — ABNORMAL HIGH (ref 0.61–1.24)
GFR calc non Af Amer: 56 mL/min — ABNORMAL LOW (ref >60)
GLUCOSE: 117 mg/dL — AB (ref 65–99)
POTASSIUM: 3.7 mmol/L (ref 3.5–5.1)
Sodium: 140 mmol/L (ref 135–145)
TOTAL PROTEIN: 7.9 g/dL (ref 6.5–8.1)

## 2015-08-31 NOTE — ED Notes (Signed)
Pt is from [facility]. Facility took ivc paperwork out on him due to si ideation. Pt has been tearing up facility and has scratches to the right forearm from glass. Pt has had multiple episodes of taking torn up items and trying to hurt himself.

## 2015-08-31 NOTE — ED Provider Notes (Signed)
CSN: [id number]     Arrival date & time 08/31/15  2238 History  By signing my name below, I, Iona Beard, attest that this documentation has been prepared under the direction and in the presence of Devoria Albe, MD at 23:12 PM.   Electronically Signed: Iona Beard, ED Scribe. 09/01/2015. 1:19 AM   Chief Complaint  Patient presents with  . V70.1    The history is provided by the patient. No language interpreter was used.   Level 5 Caveat for psychiatric evaluation   HPI Comments: [name] Larson is a 35 y.o. male who presents to the Emergency Department for evaluation following suicidal ideations, beginning earlier today. Pt is from [facility] and the facility took out IVC paperwork due to his suicidal thoughts. Pt notes superficial abrasions on his right forearm which he states "I did with a nail". He says that he did it "because I want to be in a casket". He states that he was mad which prompted him to try to hurt himself with the nail. Pt has been at the group home since July 2016 and does not like it. No other associated symptoms noted. No worsening or alleviating factors noted. Pt denies any other pertinent symptoms, but answers a lot of questions with "I don't know".  PCP Dr Carollee Massed  Past Medical History  Diagnosis Date  . Hypertension   . DVT (deep venous thrombosis) (HCC)   . Renal disorder     kideny failure  . Anxiety   . Schizophrenia (HCC)   . Sleep apnea   . Moderate intellectual disability   . Klinefelter syndrome   . Neuroleptic malignant syndrome   . Neutropenia (HCC)    History reviewed. No pertinent past surgical history. No family history on file. Social History  Substance Use Topics  . Smoking status: Current Every Day Smoker -- 0.50 packs/day    Types: Cigarettes  . Smokeless tobacco: None  . Alcohol Use: No  lives in present group home since July 2016  Review of Systems  Skin:       Superficial abrasion to right forearm.   Psychiatric/Behavioral: Positive for suicidal ideas.     Allergies  Lamotrigine; Clozaril; and Klonopin  Home Medications   Prior to Admission medications   Medication Sig Start Date End Date Taking? Authorizing Provider  gemfibrozil (LOPID) 600 MG tablet Take 600 mg by mouth 2 (two) times daily before a meal.   Yes Historical Provider, MD  hydrOXYzine (VISTARIL) 25 MG capsule Take 25 mg by mouth daily.   Yes Historical Provider, MD  lovastatin (MEVACOR) 40 MG tablet Take 40 mg by mouth at bedtime.   Yes Historical Provider, MD  loxapine (LOXITANE) 10 MG capsule Take 10 mg by mouth 2 (two) times daily.   Yes Historical Provider, MD  miconazole (ZEASORB-AF) 2 % powder Apply 1 application topically 2 (two) times daily. 11/18/13  Yes Historical Provider, MD  mometasone (NASONEX) 50 MCG/ACT nasal spray Place 2 sprays into the nose daily.   Yes Historical Provider, MD  Omega-3 Fatty Acids (FISH OIL) 1000 MG CAPS Take 4,000 mg by mouth daily.   Yes Historical Provider, MD  paliperidone (INVEGA SUSTENNA) 234 MG/1.5ML SUSP injection Inject 234 mg into the muscle every 30 (thirty) days.    Yes Historical Provider, MD  rivaroxaban (XARELTO) 20 MG TABS tablet Take 20 mg by mouth daily.    Yes Historical Provider, MD  sodium bicarbonate 650 MG tablet Take 650 mg by mouth  2 (two) times daily.   Yes Historical Provider, MD  HYDROcodone-acetaminophen (NORCO/VICODIN) 5-325 MG tablet Take 1 tablet by mouth every 4 (four) hours as needed. Patient not taking: Reported on 07/12/2015 07/05/15   Burgess Amor, PA-C   BP 114/84 mmHg  Pulse 81  Temp(Src) 98.4 F (36.9 C)  Resp 20  Ht  (1.905 m)  Wt 270 lb (122.471 kg)  BMI 33.75 kg/m2  SpO2 97%  Vital signs normal    Physical Exam  Constitutional: He is oriented to person, place, and time. He appears well-developed and well-nourished.  Non-toxic appearance. He does not appear ill. No distress.  HENT:  Head: Normocephalic and atraumatic.  Right Ear:  External ear normal.  Left Ear: External ear normal.  Nose: Nose normal. No mucosal edema or rhinorrhea.  Mouth/Throat: Oropharynx is clear and moist and mucous membranes are normal. No dental abscesses or uvula swelling.  Eyes: Conjunctivae and EOM are normal. Pupils are equal, round, and reactive to light.  Neck: Normal range of motion and full passive range of motion without pain. Neck supple.  Cardiovascular: Normal rate, regular rhythm and normal heart sounds.  Exam reveals no gallop and no friction rub.   No murmur heard. Pulmonary/Chest: Effort normal and breath sounds normal. No respiratory distress. He has no wheezes. He has no rhonchi. He has no rales. He exhibits no tenderness and no crepitus.  Abdominal: Soft. Normal appearance and bowel sounds are normal. He exhibits no distension. There is no tenderness. There is no rebound and no guarding.  Musculoskeletal: Normal range of motion. He exhibits no edema or tenderness.  Moves all extremities well.   Neurological: He is alert and oriented to person, place, and time. He has normal strength. No cranial nerve deficit.  Skin: Skin is warm and dry. Abrasion noted. No rash noted. No erythema. No pallor.  A few linear superficial abrasions noted to volar aspect of right forearm.  Psychiatric: His mood appears not anxious. His speech is delayed. He is slowed. He exhibits a depressed mood. He expresses suicidal ideation.  Flat affect. Pt does not want to answer questions. Brief, 1-2 word responses to questions.  Nursing note and vitals reviewed.      ED Course  Procedures (including critical care time)  Medications  acetaminophen (TYLENOL) tablet 650 mg (not administered)  zolpidem (AMBIEN) tablet 10 mg (not administered)  nicotine (NICODERM CQ - dosed in mg/24 hours) patch 21 mg (not administered)  ondansetron (ZOFRAN) tablet 4 mg (not administered)  alum & mag hydroxide-simeth (MAALOX/MYLANTA) 200-200-20 MG/5ML suspension 30 mL  (not administered)  gemfibrozil (LOPID) tablet 600 mg (not administered)  hydrOXYzine (VISTARIL) capsule 25 mg (not administered)  pravastatin (PRAVACHOL) tablet 40 mg (not administered)  loxapine (LOXITANE) capsule 10 mg (not administered)  miconazole (MICOTIN) 2 % powder 1 application (not administered)  sodium bicarbonate tablet 650 mg (not administered)  rivaroxaban (XARELTO) tablet 20 mg (not administered)  fluticasone (FLONASE) 50 MCG/ACT nasal spray 1 spray (not administered)    DIAGNOSTIC STUDIES: Oxygen Saturation is 97% on RA, normal by my interpretation.    COORDINATION OF CARE: 11:21 PM Discussed treatment plan with pt at bedside and pt agreed to plan.  12:10 AM Dr. Lynelle Doctor has finished 2nd opinion IVC papers.  12:17 AM IVC papers filled out by group home state that pt has been tying shoelaces and belts around his neck at the home. He has been breaking furniture to get sharp objects to hurt himself. He threatened harm to  others. He has attempted to get sharp objects from the kitchen. He has removed nails from dresser to stab himself in the wrist. Pt attempted to barricade himself in his room this evening. 1.5 weeks ago he intentionally hit his head against a car window, breaking the glass, in an attempt to get a shard of glass to harm himself. Diagnosed with impulse control disorder, moderate intellectual disability, and klinefelter syndrome.  12:40 AM psych holding orders written  01:59 AM [place], TSS, has evaluated patient and recommended psychiatrist to evaluate in am after talking to PA. She was not aware of the IVC statements written by his group home and will discuss with PA.   05:00 Have not heard any change in plan from [place], TSS.   Labs Review Results for orders placed or performed during the hospital encounter of 08/31/15  Comprehensive metabolic panel  Result Value Ref Range   Sodium 140 135 - 145 mmol/L   Potassium 3.7 3.5 - 5.1 mmol/L   Chloride 108 101 -  111 mmol/L   CO2 25 22 - 32 mmol/L   Glucose, Bld 117 (H) 65 - 99 mg/dL   BUN 25 (H) 6 - 20 mg/dL   Creatinine, Ser 1.61 (H) 0.61 - 1.24 mg/dL   Calcium 9.3 8.9 - 09.6 mg/dL   Total Protein 7.9 6.5 - 8.1 g/dL   Albumin 4.1 3.5 - 5.0 g/dL   AST 43 (H) 15 - 41 U/L   ALT 42 17 - 63 U/L   Alkaline Phosphatase 98 38 - 126 U/L   Total Bilirubin 0.9 0.3 - 1.2 mg/dL   GFR calc non Af Amer 56 (L) >60 mL/min   GFR calc Af Amer >60 >60 mL/min   Anion gap 7 5 - 15  CBC with Differential  Result Value Ref Range   WBC 5.6 4.0 - 10.5 K/uL   RBC 4.91 4.22 - 5.81 MIL/uL   Hemoglobin 14.3 13.0 - 17.0 g/dL   HCT 04.5 40.9 - 81.1 %   MCV 86.4 78.0 - 100.0 fL   MCH 29.1 26.0 - 34.0 pg   MCHC 33.7 30.0 - 36.0 g/dL   RDW 91.4 78.2 - 95.6 %   Platelets 150 150 - 400 K/uL   Neutrophils Relative % 56 %   Lymphocytes Relative 33 %   Monocytes Relative 11 %   Eosinophils Relative 0 %   Basophils Relative 0 %   Neutro Abs 3.2 1.7 - 7.7 K/uL   Lymphs Abs 1.8 0.7 - 4.0 K/uL   Monocytes Absolute 0.6 0.1 - 1.0 K/uL   Eosinophils Absolute 0.0 0.0 - 0.7 K/uL   Basophils Absolute 0.0 0.0 - 0.1 K/uL  Ethanol  Result Value Ref Range   Alcohol, Ethyl (B) <5 <5 mg/dL  Acetaminophen level  Result Value Ref Range   Acetaminophen (Tylenol), Serum <10 (L) 10 - 30 ug/mL  Salicylate level  Result Value Ref Range   Salicylate Lvl <4.0 2.8 - 30.0 mg/dL   Laboratory interpretation all normal except stable renal insufficiency  I have personally reviewed and evaluated these images and lab results as part of my medical decision-making.    MDM    Final diagnoses:  Depression  Suicidal ideation  Injury, self-inflicted   Disposition pending  Devoria Albe, MD, FACEP   I personally performed the services described in this documentation, which was scribed in my presence. The recorded information has been reviewed and considered.  Devoria Albe, MD, Armando Gang  Devoria AlbeIva Maniyah Moller, MD 09/01/15 [phone]

## 2015-09-01 ENCOUNTER — Encounter (HOSPITAL_COMMUNITY): Payer: Self-pay | Admitting: Emergency Medicine

## 2015-09-01 DIAGNOSIS — F251 Schizoaffective disorder, depressive type: Secondary | ICD-10-CM

## 2015-09-01 DIAGNOSIS — F71 Moderate intellectual disabilities: Secondary | ICD-10-CM

## 2015-09-01 LAB — RAPID URINE DRUG SCREEN, HOSP PERFORMED
Amphetamines: NOT DETECTED
BARBITURATES: NOT DETECTED
Benzodiazepines: NOT DETECTED
Cocaine: NOT DETECTED
OPIATES: NOT DETECTED
TETRAHYDROCANNABINOL: NOT DETECTED

## 2015-09-01 LAB — CBC WITH DIFFERENTIAL/PLATELET
BASOS PCT: 0 %
Basophils Absolute: 0 10*3/uL (ref 0.0–0.1)
EOS PCT: 0 %
Eosinophils Absolute: 0 10*3/uL (ref 0.0–0.7)
HEMATOCRIT: 42.4 % (ref 39.0–52.0)
HEMOGLOBIN: 14.3 g/dL (ref 13.0–17.0)
LYMPHS ABS: 1.8 10*3/uL (ref 0.7–4.0)
LYMPHS PCT: 33 %
MCH: 29.1 pg (ref 26.0–34.0)
MCHC: 33.7 g/dL (ref 30.0–36.0)
MCV: 86.4 fL (ref 78.0–100.0)
Monocytes Absolute: 0.6 10*3/uL (ref 0.1–1.0)
Monocytes Relative: 11 %
NEUTROS PCT: 56 %
Neutro Abs: 3.2 10*3/uL (ref 1.7–7.7)
Platelets: 150 10*3/uL (ref 150–400)
RBC: 4.91 MIL/uL (ref 4.22–5.81)
RDW: 14.4 % (ref 11.5–15.5)
WBC: 5.6 10*3/uL (ref 4.0–10.5)

## 2015-09-01 LAB — ETHANOL: Alcohol, Ethyl (B): <5 mg/dL (ref <5)

## 2015-09-01 LAB — ACETAMINOPHEN LEVEL

## 2015-09-01 LAB — SALICYLATE LEVEL

## 2015-09-01 MED ORDER — ZOLPIDEM TARTRATE 5 MG PO TABS: 10.0000 mg | ORAL_TABLET | Freq: Every evening | ORAL | Status: DC | PRN

## 2015-09-01 MED ORDER — FLUTICASONE PROPIONATE 50 MCG/ACT NA SUSP
1.0000 | Freq: Every day | NASAL | Status: DC
  Administered 2015-09-01 – 2015-09-02 (×2): 1 via NASAL
  Filled 2015-09-01: qty 16

## 2015-09-01 MED ORDER — MICONAZOLE NITRATE 2 % EX POWD
1.0000 "application " | Freq: Two times a day (BID) | CUTANEOUS | Status: DC
  Filled 2015-09-01: qty 1

## 2015-09-01 MED ORDER — SODIUM BICARBONATE 650 MG PO TABS
650.0000 mg | ORAL_TABLET | Freq: Two times a day (BID) | ORAL | Status: DC
  Administered 2015-09-01 (×2): 650 mg via ORAL
  Filled 2015-09-01 (×8): qty 1

## 2015-09-01 MED ORDER — PRAVASTATIN SODIUM 40 MG PO TABS
40.0000 mg | ORAL_TABLET | Freq: Every day | ORAL | Status: DC
  Administered 2015-09-01: 40 mg via ORAL
  Filled 2015-09-01 (×2): qty 1

## 2015-09-01 MED ORDER — GEMFIBROZIL 600 MG PO TABS
600.0000 mg | ORAL_TABLET | Freq: Two times a day (BID) | ORAL | Status: DC
  Administered 2015-09-01 – 2015-09-02 (×3): 600 mg via ORAL
  Filled 2015-09-01 (×6): qty 1

## 2015-09-01 MED ORDER — RIVAROXABAN 20 MG PO TABS
20.0000 mg | ORAL_TABLET | Freq: Every day | ORAL | Status: DC
  Administered 2015-09-01 – 2015-09-02 (×2): 20 mg via ORAL
  Filled 2015-09-01 (×4): qty 1

## 2015-09-01 MED ORDER — ACETAMINOPHEN 325 MG PO TABS: 650.0000 mg | ORAL_TABLET | ORAL | Status: DC | PRN

## 2015-09-01 MED ORDER — NYSTATIN 100000 UNIT/GM EX POWD
Freq: Two times a day (BID) | CUTANEOUS | Status: DC
  Administered 2015-09-01 – 2015-09-02 (×3): via TOPICAL
  Filled 2015-09-01: qty 15

## 2015-09-01 MED ORDER — LOXAPINE SUCCINATE 5 MG PO CAPS
10.0000 mg | ORAL_CAPSULE | Freq: Two times a day (BID) | ORAL | Status: DC
  Administered 2015-09-01 – 2015-09-02 (×3): 10 mg via ORAL
  Filled 2015-09-01: qty 1
  Filled 2015-09-01 (×5): qty 2

## 2015-09-01 MED ORDER — IBUPROFEN 400 MG PO TABS: 600.0000 mg | ORAL_TABLET | Freq: Three times a day (TID) | ORAL | Status: DC | PRN

## 2015-09-01 MED ORDER — ONDANSETRON HCL 4 MG PO TABS: 4.0000 mg | ORAL_TABLET | Freq: Three times a day (TID) | ORAL | Status: DC | PRN

## 2015-09-01 MED ORDER — HYDROXYZINE HCL 25 MG PO TABS
25.0000 mg | ORAL_TABLET | Freq: Every day | ORAL | Status: DC
  Administered 2015-09-01 – 2015-09-02 (×2): 25 mg via ORAL
  Filled 2015-09-01 (×2): qty 1

## 2015-09-01 MED ORDER — NICOTINE 21 MG/24HR TD PT24
21.0000 mg | MEDICATED_PATCH | Freq: Every day | TRANSDERMAL | Status: DC
  Administered 2015-09-01 – 2015-09-02 (×2): 21 mg via TRANSDERMAL
  Filled 2015-09-01 (×2): qty 1

## 2015-09-01 MED ORDER — HYDROXYZINE PAMOATE 25 MG PO CAPS
25.0000 mg | ORAL_CAPSULE | Freq: Every day | ORAL | Status: DC
  Filled 2015-09-01: qty 1

## 2015-09-01 MED ORDER — ALUM & MAG HYDROXIDE-SIMETH 200-200-20 MG/5ML PO SUSP: 30.0000 mL | ORAL | Status: DC | PRN

## 2015-09-01 NOTE — ED Notes (Signed)
Patient asleep lunch tray placed at bedside.

## 2015-09-01 NOTE — Progress Notes (Addendum)
Discussed pt's case with psych team. Pt to be evaluated today via telepsych.  Contacted [facility]- clinician to come to ED today to provide crisis assessment. (pt is consumer of [facility] due to moderate I/DD- IQ 8755- psychological testing on file).  Contacted pt's mother [name] Larson [phone] legal guardian (paperwork on file). She states that she was aware pt in ED as he called her last night to inform her. She states, "[name] Larson has had an explosive couple of months. He's been saying he's suicidal often. A couple of weeks ago tried to use broken glass to cut himself. Has a history of doing this. Last month we took him to his psychiatrist (Dr. Jinny Larson) to see if any medications need to be changed. He did add a medication, cannot recall what is was, to add to his Invega, but we don't know yet if it is helping. He needs some help." CSW informed her her concerns will be shared with psych team.  Pt awaiting [facility] evaluation, [facility] Will continue following pt's case.   12pm: [facility] clinician [name] Larson states she is coming to ED to provide crisis assessment for pt. Informs us that pt was also assessed [facility] at [facility] for similar symptoms, recommended that pt does not meet criteria and returned to group home. Cannot provide copy of that assessment at this time due to not yet having it filed in office. Spoke with [name] Larson, [phone], administrator with [facility]. He reports pt was brought to [facility] either Sunday 6/18 or Monday 6/19 due to suicidal threats and generally escalating behavioral issues in the group home over the past few weeks. Unclear the precise behaviors leading to the hospital admission, as administrator describes incidents such as "tearing up his room except for his tv and xbox, breaking a glass and stating he will cut himself with it, trying to cut himself with nails, trying to break the window and run away," however when  asked if this occurred just prior to hospital admission he states "this was over the past little while. Last night we saw his threats escalating again and wanted to prevent any harm, so we took out the IVC. He did not harm anyone or himself at that time to our knowledge." [name] also notes that they have been working to pursue crisis bed elsewhere for pt and emergency d/c from their facility. Notes [name] Larson with [facility] is pt's care coordinator and he informed her of pt's status in ED. CSW left voicemail for Ms. Larson at [phone].

## 2015-09-01 NOTE — Progress Notes (Signed)
This Clinical research associatewriter received a call from Dr. Luan MooreUzama Price [phone] with [facility] who is currently working on the patient' behavioral treatment team.  She reports that the patient is addicted to smoking which he uses 6 or more cigarettes daily. When he smokes he begins to have dizzy spells, shortness of breathe, and choking.  She reports that the patient is recommended to wear a C-PAP machine at night but is refusing therefore his sleeping patterns are poor.  Per Dr. Samuella CotaPrice report the patient has broke a window a couple days ago to cut self and took a nail to attempt to cut self.  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  She reports his medical physicians has tried patches to help with the patient stop smoking but it was not successful.   She is available if anyone need to call her back.      Maryelizabeth Rowanressa Dontarious Schaum, MSW, Clare CharonLCSW, LCAS [facility] Triage Specialist [phone]

## 2015-09-01 NOTE — ED Notes (Signed)
meds not available for patient as ordered. Loxitane, micotin, sodium bicarb

## 2015-09-01 NOTE — ED Notes (Signed)
Pharmacy notified of need for pt's 0800 medications.

## 2015-09-01 NOTE — BH Assessment (Addendum)
Tele Assessment Note   [name] Larson is an 35 y.o. male presenting to [facility] under IVC due to scratching his arm with a nail. PT stated "I was thinking about killing myself". "I scraped my arm when I was at the group home". Pt reported that he has attempted suicide multiple times in the past but did not shared any self-injurious behaviors. Pt currently lives in a group home and shared that he does not like it there and would like to find somewhere else to live. Pt did not report any drug or alcohol use.   It is recommended that pt be evaluated by the psychiatrist for final disposition.   Diagnosis: Schizophrenia   Past Medical History:  Past Medical History  Diagnosis Date  . Hypertension   . DVT (deep venous thrombosis) (HCC)   . Renal disorder     kideny failure  . Anxiety   . Schizophrenia (HCC)   . Sleep apnea   . Moderate intellectual disability   . Klinefelter syndrome   . Neuroleptic malignant syndrome   . Neutropenia (HCC)     History reviewed. No pertinent past surgical history.  Family History: No family history on file.  Social History:  reports that he has been smoking Cigarettes.  He has been smoking about 0.50 packs per day. He does not have any smokeless tobacco history on file. He reports that he does not drink alcohol or use illicit drugs.  Additional Social History:  Alcohol / Drug Use History of alcohol / drug use?: No history of alcohol / drug abuse  CIWA: CIWA-Ar BP: 114/84 mmHg Pulse Rate: 81 COWS:    PATIENT STRENGTHS: (choose at least two) Supportive family/friends  Allergies:  Allergies  Allergen Reactions  . Lamotrigine Shortness Of Breath and Nausea And Vomiting    "I was about to die"  . Clozaril [Clozapine] Other (See Comments)  . Klonopin [Clonazepam] Other (See Comments)    Reaction unknown. From a long time ago.    Home Medications:  (Not in a hospital admission)  OB/GYN Status:  No LMP for male patient.  General Assessment  Data Location of Assessment: [facility] TTS Assessment: In system Is this a Tele or Face-to-Face Assessment?: Tele Assessment Is this an Initial Assessment or a Re-assessment for this encounter?: Initial Assessment Marital status: Single Living Arrangements: Group Home ([facility] ) Can pt return to current living arrangement?: Yes Admission Status: Involuntary Is patient capable of signing voluntary admission?: Yes Referral Source: Self/Family/Friend Insurance type: Medicaid      Crisis Care Plan Living Arrangements: Group Home ([facility] ) Name of Psychiatrist: Dr. Jinny Sanders Name of Therapist: Therapist located at the [facility]  (Patient was not able to rememer the name)  Education Status Is patient currently in school?: No Current Grade: NA Highest grade of school patient has completed: NA Name of school: NA Contact person: NA  Risk to self with the past 6 months Suicidal Ideation: Yes-Currently Present Has patient been a risk to self within the past 6 months prior to admission? : Yes Suicidal Intent: Yes-Currently Present Has patient had any suicidal intent within the past 6 months prior to admission? : Yes Is patient at risk for suicide?: Yes Suicidal Plan?: Yes-Currently Present Has patient had any suicidal plan within the past 6 months prior to admission? : Yes Specify Current Suicidal Plan: cut arm with a nail  Access to Means: Yes Specify Access to Suicidal Means: nail What has been your use of drugs/alcohol within  the last 12 months?: Pt denies  Previous Attempts/Gestures: Yes How many times?: 5 Other Self Harm Risks: None reported  Triggers for Past Attempts: Hallucinations Intentional Self Injurious Behavior: None Comment - Self Injurious Behavior: Pt denies  Family Suicide History: No Recent stressful life event(s): Conflict (Comment) (problems with the staff at the group home ) Persecutory voices/beliefs?: No Depression: No Depression Symptoms: Isolating, Loss  of interest in usual pleasures Substance abuse history and/or treatment for substance abuse?: No Suicide prevention information given to non-admitted patients: Not applicable  Risk to Others within the past 6 months Homicidal Ideation: No Does patient have any lifetime risk of violence toward others beyond the six months prior to admission? : No Thoughts of Harm to Others: No Current Homicidal Intent: No Current Homicidal Plan: No Access to Homicidal Means: No Identified Victim: None  History of harm to others?: No Assessment of Violence: None Noted Violent Behavior Description: No violent behaviors observed.  Does patient have access to weapons?: No Criminal Charges Pending?: No Does patient have a court date: No Is patient on probation?: No  Psychosis Hallucinations: None noted Delusions: None noted  Mental Status Report Appearance/Hygiene: In scrubs Eye Contact: Poor Motor Activity: Freedom of movement Speech: Logical/coherent Level of Consciousness: Restless Mood: Depressed Affect: Anxious Anxiety Level: Minimal Thought Processes: Relevant, Coherent Judgement: Unimpaired Orientation: Place, Person, Time, Situation Obsessive Compulsive Thoughts/Behaviors: None  Cognitive Functioning Concentration: Normal Memory: Recent Intact, Remote Intact IQ: Average Insight: Poor Impulse Control: Poor Appetite: Good Weight Loss: 0 Weight Gain: 0 Sleep: No Change Total Hours of Sleep: 8 Vegetative Symptoms: Staying in bed  ADLScreening [facility]) Patient's cognitive ability adequate to safely complete daily activities?: Yes Patient able to express need for assistance with ADLs?: Yes Independently performs ADLs?: Yes (appropriate for developmental age)  Prior Inpatient Therapy Prior Inpatient Therapy: Yes Prior Therapy Dates: Feb 2017 Prior Therapy Facilty/Provider(s): Unable to remember the name Reason for Treatment: SI/Psychosis  Prior Outpatient  Therapy Prior Outpatient Therapy: Yes Prior Therapy Dates: Ongoing  Prior Therapy Facilty/Provider(s): Dr. [facility] Reason for Treatment: Medication Management  Does patient have an ACCT team?: Unknown Does patient have Intensive In-House Services?  : No Does patient have [facility] services? : No Does patient have P4CC services?: No  ADL Screening (condition at time of admission) Patient's cognitive ability adequate to safely complete daily activities?: Yes Is the patient deaf or have difficulty hearing?: No Does the patient have difficulty seeing, even when wearing glasses/contacts?: No Does the patient have difficulty concentrating, remembering, or making decisions?: Yes Patient able to express need for assistance with ADLs?: Yes Does the patient have difficulty dressing or bathing?: No Independently performs ADLs?: Yes (appropriate for developmental age)       Abuse/Neglect Assessment (Assessment to be complete while patient is alone) Physical Abuse: Denies Verbal Abuse: Denies Sexual Abuse: Denies Exploitation of patient/patient's resources: Denies Self-Neglect: Denies     Merchant navy officerAdvance Directives (For Healthcare) Does patient have an advance directive?: No    Additional Information 1:1 In Past 12 Months?: No CIRT Risk: No Elopement Risk: No Does patient have medical clearance?: Yes     Disposition:  Disposition Initial Assessment Completed for this Encounter: Yes Disposition of Patient: Other dispositions (AM Psych eval )  Anali Cabanilla S 09/01/2015 1:55 AM

## 2015-09-01 NOTE — ED Notes (Signed)
Breakfast tray given to pt 

## 2015-09-01 NOTE — Consult Note (Signed)
Telepsych Consultation   Reason for Consult: Psychosis/Suicidal Ideation  Referring Physician: EDP Patient Identification: [name] Larson MRN:  [id number] Principal Diagnosis: Schizoaffective disorder and moderate IDD Diagnosis:   Patient Active Problem List   Diagnosis Date Noted  . MR (mental retardation), moderate [F71] 08/05/2013  . Conduct disorder [F91.9] 08/05/2013  . Schizoaffective disorder ([place]) [F25.9] 01/30/2007  . BIPOLAR DISORDER UNSPECIFIED [F31.9] 01/30/2007  . DEPRESSION [F32.9] 01/30/2007  . 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 NEUROLEPTIC MALIGNANT SYNDROME [G21.0] 01/30/2007  . ASTHMA [J45.909] 01/30/2007  . 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 SLEEP APNEA [G47.30] 01/30/2007    Total Time spent with patient: 30 minutes  Subjective:   [name] Larson is a 35 y.o. male patient presenting to ED s/p minor self-injurious behavior not requiring sutures. Pt seen and chart reviewed. Pt is alert/oriented x4, calm, cooperative, and appropriate to situation. Pt denies suicidal/homicidal ideation and psychosis and does not appear to be responding to internal stimuli. Pt reports that he was "upset" and that he is "very sorry" about his actions and "just wants to go home now." Pt is smiling and with a calm and pleasant demeanor, stating that he feels safe to go home and that he is no longer upset or suicidal.  HPI: I have reviewed and concur with HPI elements below, modified as follows: [name] Larson is an 35 y.o. male presenting to [facility] under [facility]C due to scratching his arm with a nail. PT stated "I was thinking about killing myself". "I scraped my arm when I was at the group home". Pt reported that he has attempted suicide multiple times in the past but did not shared any self-injurious behaviors. Pt currently lives in a group home and shared that he does not like it there and would like to find somewhere else to live. Pt did not report any drug or alcohol use.    Today on 09/01/2015 pt seen for psychiatric evaluation as above. Pt has a history of behavioral concerns and will be evaluated by [facility] prior to this evaluation.  We will benefit from their insight as pt does have known intellectual disabilities.   Past Psychiatric History: Schizoaffective   Risk to Self: Suicidal Ideation: Yes-Currently Present Suicidal Intent: Yes-Currently Present Is patient at risk for suicide?: Yes Suicidal Plan?: Yes-Currently Present Specify Current Suicidal Plan: cut arm with a nail  Access to Means: Yes Specify Access to Suicidal Means: nail What has been your use of drugs/alcohol within the last 12 months?: Pt denies  How many times?: 5 Other Self Harm Risks: None reported  Triggers for Past Attempts: Hallucinations Intentional Self Injurious Behavior: None Comment - Self Injurious Behavior: Pt denies  Risk to Others: Homicidal Ideation: No Thoughts of Harm to Others: No Current Homicidal Intent: No Current Homicidal Plan: No Access to Homicidal Means: No Identified Victim: None  History of harm to others?: No Assessment of Violence: None Noted Violent Behavior Description: No violent behaviors observed.  Does patient have access to weapons?: No Criminal Charges Pending?: No Does patient have a court date: No Prior Inpatient Therapy: Prior Inpatient Therapy: Yes Prior Therapy Dates: Feb 2017 Prior Therapy Facilty/Provider(s): Unable to remember the name Reason for Treatment: SI/Psychosis Prior Outpatient Therapy: Prior Outpatient Therapy: Yes Prior Therapy Dates: Ongoing  Prior Therapy Facilty/Provider(s): Dr. Ma Rings Reason for Treatment: Medication Management  Does patient have an ACCT team?: Unknown Does patient have Intensive In-House Services?  : No Does patient have [facility] services? : No Does  patient have P4CC services?: No  Past Medical History:  Past Medical History  Diagnosis Date  . Hypertension   . DVT (deep venous thrombosis)  ([place])   . Renal disorder     kideny failure  . Anxiety   . Schizophrenia ([place])   . Sleep apnea   . Moderate intellectual disability   . Klinefelter syndrome   . Neuroleptic malignant syndrome   . Neutropenia ([place])    History reviewed. No pertinent past surgical history. Family History:  Social History:  History  Alcohol Use No     History  Drug Use No    Social History   Social History  . Marital Status: Single    Spouse Name: N/A  . Number of Children: N/A  . Years of Education: N/A   Social History Main Topics  . Smoking status: Current Every Day Smoker -- 0.50 packs/day    Types: Cigarettes  . Smokeless tobacco: None  . Alcohol Use: No  . Drug Use: No  . Sexual Activity: Not Asked   Other Topics Concern  . None   Social History Narrative   Additional Social History:    Allergies:   Allergies  Allergen Reactions  . Lamotrigine Shortness Of Breath and Nausea And Vomiting    "I was about to die"  . Clozaril [Clozapine] Other (See Comments)  . Klonopin [Clonazepam] Other (See Comments)    Reaction unknown. From a long time ago.    Labs:  Results for orders placed or performed during the hospital encounter of 08/31/15 (from the past 48 hour(s))  Comprehensive metabolic panel     Status: Abnormal   Collection Time: 08/31/15 11:25 PM  Result Value Ref Range   Sodium 140 135 - 145 mmol/L   Potassium 3.7 3.5 - 5.1 mmol/L   Chloride 108 101 - 111 mmol/L   CO2 25 22 - 32 mmol/L   Glucose, Bld 117 (H) 65 - 99 mg/dL   BUN 25 (H) 6 - 20 mg/dL   Creatinine, Ser 1.56 (H) 0.61 - 1.24 mg/dL   Calcium 9.3 8.9 - 10.3 mg/dL   Total Protein 7.9 6.5 - 8.1 g/dL   Albumin 4.1 3.5 - 5.0 g/dL   AST 43 (H) 15 - 41 U/L   ALT 42 17 - 63 U/L   Alkaline Phosphatase 98 38 - 126 U/L   Total Bilirubin 0.9 0.3 - 1.2 mg/dL   GFR calc non Af Amer 56 (L) >60 mL/min   GFR calc Af Amer >60 >60 mL/min    Comment: (NOTE) The eGFR has been calculated using the CKD EPI  equation. This calculation has not been validated in all clinical situations. eGFR's persistently <60 mL/min signify possible Chronic Kidney Disease.    Anion gap 7 5 - 15  CBC with Differential     Status: None   Collection Time: 08/31/15 11:25 PM  Result Value Ref Range   WBC 5.6 4.0 - 10.5 K/uL   RBC 4.91 4.22 - 5.81 MIL/uL   Hemoglobin 14.3 13.0 - 17.0 g/dL   HCT 42.4 39.0 - 52.0 %   MCV 86.4 78.0 - 100.0 fL   MCH 29.1 26.0 - 34.0 pg   MCHC 33.7 30.0 - 36.0 g/dL   RDW 14.4 11.5 - 15.5 %   Platelets 150 150 - 400 K/uL   Neutrophils Relative % 56 %   Lymphocytes Relative 33 %   Monocytes Relative 11 %   Eosinophils Relative 0 %   Basophils  Relative 0 %   Neutro Abs 3.2 1.7 - 7.7 K/uL   Lymphs Abs 1.8 0.7 - 4.0 K/uL   Monocytes Absolute 0.6 0.1 - 1.0 K/uL   Eosinophils Absolute 0.0 0.0 - 0.7 K/uL   Basophils Absolute 0.0 0.0 - 0.1 K/uL  Ethanol     Status: None   Collection Time: 08/31/15 11:25 PM  Result Value Ref Range   Alcohol, Ethyl (B) <5 <5 mg/dL    Comment:        LOWEST DETECTABLE LIMIT FOR SERUM ALCOHOL IS 5 mg/dL FOR MEDICAL PURPOSES ONLY   Acetaminophen level     Status: Abnormal   Collection Time: 08/31/15 11:25 PM  Result Value Ref Range   Acetaminophen (Tylenol), Serum <10 (L) 10 - 30 ug/mL    Comment:        THERAPEUTIC CONCENTRATIONS VARY SIGNIFICANTLY. A RANGE OF 10-30 ug/mL MAY BE AN EFFECTIVE CONCENTRATION FOR MANY PATIENTS. HOWEVER, SOME ARE BEST TREATED AT CONCENTRATIONS OUTSIDE THIS RANGE. ACETAMINOPHEN CONCENTRATIONS >150 ug/mL AT 4 HOURS AFTER INGESTION AND >50 ug/mL AT 12 HOURS AFTER INGESTION ARE OFTEN ASSOCIATED WITH TOXIC REACTIONS.   Salicylate level     Status: None   Collection Time: 08/31/15 11:25 PM  Result Value Ref Range   Salicylate Lvl <7.0 2.8 - 30.0 mg/dL  Urine rapid drug screen (hosp performed)     Status: None   Collection Time: 09/01/15  6:13 AM  Result Value Ref Range   Opiates NONE DETECTED NONE DETECTED    Cocaine NONE DETECTED NONE DETECTED   Benzodiazepines NONE DETECTED NONE DETECTED   Amphetamines NONE DETECTED NONE DETECTED   Tetrahydrocannabinol NONE DETECTED NONE DETECTED   Barbiturates NONE DETECTED NONE DETECTED    Comment:        DRUG SCREEN FOR MEDICAL PURPOSES ONLY.  IF CONFIRMATION IS NEEDED FOR ANY PURPOSE, NOTIFY LAB WITHIN 5 DAYS.        LOWEST DETECTABLE LIMITS FOR URINE DRUG SCREEN Drug Class       Cutoff (ng/mL) Amphetamine      1000 Barbiturate      200 Benzodiazepine   962 Tricyclics       836 Opiates          300 Cocaine          300 THC              50     Current Facility-Administered Medications  Medication Dose Route Frequency Provider Last Rate Last Dose  . acetaminophen (TYLENOL) tablet 650 mg  650 mg Oral Q4H PRN Rolland Porter, MD      . alum & mag hydroxide-simeth (MAALOX/MYLANTA) 200-200-20 MG/5ML suspension 30 mL  30 mL Oral PRN Rolland Porter, MD      . fluticasone (FLONASE) 50 MCG/ACT nasal spray 1 spray  1 spray Each Nare Daily Rolland Porter, MD      . gemfibrozil (LOPID) tablet 600 mg  600 mg Oral BID AC Rolland Porter, MD   600 mg at 09/01/15 0859  . hydrOXYzine (ATARAX/VISTARIL) tablet 25 mg  25 mg Oral Daily Rolland Porter, MD      . loxapine (LOXITANE) capsule 10 mg  10 mg Oral BID Rolland Porter, MD   10 mg at 09/01/15 0716  . nicotine (NICODERM CQ - dosed in mg/24 hours) patch 21 mg  21 mg Transdermal Daily Rolland Porter, MD      . nystatin (MYCOSTATIN/NYSTOP) topical powder   Topical BID Rolland Porter, MD      .  ondansetron (ZOFRAN) tablet 4 mg  4 mg Oral Q8H PRN Rolland Porter, MD      . pravastatin (PRAVACHOL) tablet 40 mg  40 mg Oral q1800 Rolland Porter, MD      . rivaroxaban (XARELTO) tablet 20 mg  20 mg Oral Q breakfast Rolland Porter, MD   20 mg at 09/01/15 0859  . sodium bicarbonate tablet 650 mg  650 mg Oral BID Rolland Porter, MD   650 mg at 09/01/15 0717  . zolpidem (AMBIEN) tablet 10 mg  10 mg Oral QHS PRN Rolland Porter, MD       Current Outpatient Prescriptions  Medication Sig  Dispense Refill  . gemfibrozil (LOPID) 600 MG tablet Take 600 mg by mouth 2 (two) times daily before a meal.    . hydrOXYzine (VISTARIL) 25 MG capsule Take 25 mg by mouth daily.    Marland Kitchen lovastatin (MEVACOR) 40 MG tablet Take 40 mg by mouth at bedtime.    Marland Kitchen loxapine (LOXITANE) 10 MG capsule Take 10 mg by mouth 2 (two) times daily.    . miconazole (ZEASORB-AF) 2 % powder Apply 1 application topically 2 (two) times daily.    . mometasone (NASONEX) 50 MCG/ACT nasal spray Place 2 sprays into the nose daily.    . Omega-3 Fatty Acids (FISH OIL) 1000 MG CAPS Take 4,000 mg by mouth daily.    . paliperidone (INVEGA SUSTENNA) 234 MG/1.5ML SUSP injection Inject 234 mg into the muscle every 30 (thirty) days.     . rivaroxaban (XARELTO) 20 MG TABS tablet Take 20 mg by mouth daily.     . sodium bicarbonate 650 MG tablet Take 650 mg by mouth 2 (two) times daily.    Marland Kitchen HYDROcodone-acetaminophen (NORCO/VICODIN) 5-325 MG tablet Take 1 tablet by mouth every 4 (four) hours as needed. (Patient not taking: Reported on 07/12/2015) 10 tablet 0    Musculoskeletal:  Unable to assess via camera   Psychiatric Specialty Exam: Review of Systems  Constitutional: Negative.   HENT: Negative.   Eyes: Negative.   Respiratory: Negative.   Cardiovascular: Negative.   Gastrointestinal: Negative.   Genitourinary: Negative.   Musculoskeletal: Negative.   Skin: Negative.   Neurological: Negative.   Endo/Heme/Allergies: Negative.   Psychiatric/Behavioral: Negative for depression, suicidal ideas, hallucinations, memory loss and substance abuse. The patient is not nervous/anxious and does not have insomnia.   All other systems reviewed and are negative.   Blood pressure 119/71, pulse 54, temperature 98.3 F (36.8 C), temperature source Oral, resp. rate 18, height _0  (1.905 m), weight 122.471 kg (270 lb), SpO2 96 %.Body mass index is 33.75 kg/(m^2).  General Appearance: Casual and Fairly Groomed  Engineer, water::  Good  Speech:   Clear and Coherent and Slow  Volume:  Normal  Mood:  Anxious  Affect:  Appropriate  Thought Process:  Goal Directed and Intact  Orientation:  Full (Time, Place, and Person)  Thought Content:  symptoms, worries, concerns  Suicidal Thoughts:  No  Homicidal Thoughts:  No  Memory:  Immediate;   Fair Recent;   Fair Remote;   Fair  Judgement:  Fair  Insight:  Shallow  Psychomotor Activity:  Normal  Concentration:  Good  Recall:  [place] of Knowledge:Good  Language: Good  Akathisia:  No  Handed:  Right  AIMS (if indicated):     Assets:  Communication Skills Desire for Improvement Housing Leisure Time Physical Health Resilience Social Support  ADL's:  Intact  Cognition: Impaired,  Mild  Sleep:  Treatment Plan Summary: MR (mental retardation), moderate  With schizoaffective bipolar, at baseline per eval from [facility] and per group home staff; stable for outpatient management. Transient behavior disruption likely due to limited emotional reserve and range secondary to intellectual disability and concomitant psychiatric diagnoses.   Disposition:  -Pt may return to group home with supportive environment -Continue current outpatient psychiatric followup -Group home staff to secure sharps/meds (report they are already secured)  Benjamine Mola, [place] 09/01/2015 9:42 AM  Case discussed with physician extender via phone. Reviewed the information documented and agree with the treatment plan.  Bronsen Serano 09/02/2015 10:31 AM

## 2015-09-01 NOTE — BH Assessment (Signed)
Assessment completed. Consulted Donell SievertSpencer Simon, PA-C who recommended that pt be evaluated by the psychiatrist for final disposition. Informed Dr. Lynelle DoctorKnapp of the recommendation.

## 2015-09-01 NOTE — Progress Notes (Addendum)
Spoke with Charlesetta IvoryKatie Viconti- [facility] crisis clinician. She states that she provided crisis assessment for pt in [facility] earlier today and stated that "he was withdrawn and mostly upset about living in his group home. His team is looking into other living options for him going forward, but that won't change immediately, and he does not qualify for a crisis bed at this time." States [facility] recommends pt does not need inpatient psychiatric treatment at this time. Did note that pt has medical issues that they are recommending he receive follow up regarding- namely renal function. States she relayed that to medical staff. States that, should pt have no acute medical issues, [facility] pt should be d/c'd to return to group home. Relayed this to psych team. Pt awaiting telepsych re-evaluation for disposition recommendation.   Ilean SkillMeghan Jadarian Mckay, MSW, LCSW Clinical Social Work, Disposition  09/01/2015 [phone]  Received faxed copy of pt's [facility] crisis assessment. Retained copy for chart.  Was informed pt's care coordinator with [facility] is Kathi Derlaine Munden- [phone]- [phone]. Unable to leave voicemail at this number.

## 2015-09-02 NOTE — Progress Notes (Signed)
Per psychiatry evaluation and [facility] evaluation, pt does not meet inpatient criteria and can be d/c to return to his group home.  Spoke with [name] with [facility], informed him of recommendation. He is agreeable to having staff pick pt up from ED today.

## 2015-09-02 NOTE — ED Notes (Signed)
Pharmacy called for sodium bicarb po.

## 2015-09-02 NOTE — ED Notes (Signed)
Pt resting with eyes closed, appears to be in no distress. Respirations are even and unlabored.  

## 2015-09-02 NOTE — Discharge Instructions (Signed)
Please return for worsening symptoms, including harmful behavior towards self or others, altered mental status, or any other symptoms concerning to you.    Stress and Stress Management Stress is a normal reaction to life events. It is what you feel when life demands more than you are used to or more than you can handle. Some stress can be useful. For example, the stress reaction can help you catch the last bus of the day, study for a test, or meet a deadline at work. But stress that occurs too often or for too long can cause problems. It can affect your emotional health and interfere with relationships and normal daily activities. Too much stress can weaken your immune system and increase your risk for physical illness. If you already have a medical problem, stress can make it worse. CAUSES  All sorts of life events may cause stress. An event that causes stress for one person may not be stressful for another person. Major life events commonly cause stress. These may be positive or negative. Examples include losing your job, moving into a new home, getting married, having a baby, or losing a loved one. Less obvious life events may also cause stress, especially if they occur day after day or in combination. Examples include working long hours, driving in traffic, caring for children, being in debt, or being in a difficult relationship. SIGNS AND SYMPTOMS Stress may cause emotional symptoms including, the following:  Anxiety. This is feeling worried, afraid, on edge, overwhelmed, or out of control.  Anger. This is feeling irritated or impatient.  Depression. This is feeling sad, down, helpless, or guilty.  Difficulty focusing, remembering, or making decisions. Stress may cause physical symptoms, including the following:   Aches and pains. These may affect your head, neck, back, stomach, or other areas of your body.  Tight muscles or clenched jaw.  Low energy or trouble sleeping. Stress may  cause unhealthy behaviors, including the following:   Eating to feel better (overeating) or skipping meals.  Sleeping too little, too much, or both.  Working too much or putting off tasks (procrastination).  Smoking, drinking alcohol, or using drugs to feel better. DIAGNOSIS  Stress is diagnosed through an assessment by your health care provider. Your health care provider will ask questions about your symptoms and any stressful life events.Your health care provider will also ask about your medical history and may order blood tests or other tests. Certain medical conditions and medicine can cause physical symptoms similar to stress. Mental illness can cause emotional symptoms and unhealthy behaviors similar to stress. Your health care provider may refer you to a mental health professional for further evaluation.  TREATMENT  Stress management is the recommended treatment for stress.The goals of stress management are reducing stressful life events and coping with stress in healthy ways.  Techniques for reducing stressful life events include the following:  Stress identification. Self-monitor for stress and identify what causes stress for you. These skills may help you to avoid some stressful events.  Time management. Set your priorities, keep a calendar of events, and learn to say "no." These tools can help you avoid making too many commitments. Techniques for coping with stress include the following:  Rethinking the problem. Try to think realistically about stressful events rather than ignoring them or overreacting. Try to find the positives in a stressful situation rather than focusing on the negatives.  Exercise. Physical exercise can release both physical and emotional tension. The key is to find a form  of exercise you enjoy and do it regularly.  Relaxation techniques. These relax the body and mind. Examples include yoga, meditation, tai chi, biofeedback, deep breathing, progressive  muscle relaxation, listening to music, being out in nature, journaling, and other hobbies. Again, the key is to find one or more that you enjoy and can do regularly.  Healthy lifestyle. Eat a balanced diet, get plenty of sleep, and do not smoke. Avoid using alcohol or drugs to relax.  Strong support network. Spend time with family, friends, or other people you enjoy being around.Express your feelings and talk things over with someone you trust. Counseling or talktherapy with a mental health professional may be helpful if you are having difficulty managing stress on your own. Medicine is typically not recommended for the treatment of stress.Talk to your health care provider if you think you need medicine for symptoms of stress. HOME CARE INSTRUCTIONS  Keep all follow-up visits as directed by your health care provider.  Take all medicines as directed by your health care provider. SEEK MEDICAL CARE IF:  Your symptoms get worse or you start having new symptoms.  You feel overwhelmed by your problems and can no longer manage them on your own. SEEK IMMEDIATE MEDICAL CARE IF:  You feel like hurting yourself or someone else.   This information is not intended to replace advice given to you by your health care provider. Make sure you discuss any questions you have with your health care provider.   Document Released: 08/23/2000 Document Revised: 03/20/2014 Document Reviewed: 10/22/2012 Elsevier Interactive Patient Education [employer].

## 2015-09-02 NOTE — ED Notes (Signed)
Pt made aware to return if symptoms worsen or if any life threatening symptoms occur.  Pt told to come back if having SI/HI.  [facility] from [facility] here to take pt back home. Pt alert and oriented, nad at this time. Pt given belongings to take home

## 2015-09-02 NOTE — ED Notes (Signed)
Pt given coke to drink 

## 2015-09-02 NOTE — ED Notes (Signed)
[name] [phone], states he will have someone available around lunchtime to pick up pt from ED.

## 2015-09-02 NOTE — ED Notes (Signed)
RN spoke with Zollie BeckersDenesha at [phone]-states she will attempt to call [name].

## 2015-09-02 NOTE — ED Notes (Signed)
Pharmacy called for am medications.

## 2015-09-02 NOTE — ED Notes (Signed)
[facility] paperwork rescinded by EDP. Attempting to call [facility] for d/c transport.

## 2015-09-02 NOTE — ED Notes (Signed)
Pt sleeping. 

## 2015-09-02 NOTE — ED Notes (Signed)
Meal given to pt, pt also requesting socks, socks given.  Bed linens changed.

## 2015-10-15 ENCOUNTER — Encounter (HOSPITAL_COMMUNITY): Payer: Self-pay | Admitting: Emergency Medicine

## 2015-10-15 ENCOUNTER — Emergency Department (HOSPITAL_COMMUNITY)
Admission: EM | Admit: 2015-10-15 | Discharge: 2015-10-15 | Disposition: A | Discharge status: Home / Self Care | Payer: Medicaid Other | Attending: Emergency Medicine | Admitting: Emergency Medicine

## 2015-10-15 ENCOUNTER — Emergency Department (HOSPITAL_COMMUNITY): Payer: Medicaid Other

## 2015-10-15 DIAGNOSIS — S60222A Contusion of left hand, initial encounter: Secondary | ICD-10-CM | POA: Diagnosis not present

## 2015-10-15 DIAGNOSIS — Y939 Activity, unspecified: Secondary | ICD-10-CM | POA: Insufficient documentation

## 2015-10-15 DIAGNOSIS — S6992XA Unspecified injury of left wrist, hand and finger(s), initial encounter: Secondary | ICD-10-CM | POA: Diagnosis present

## 2015-10-15 DIAGNOSIS — J45909 Unspecified asthma, uncomplicated: Secondary | ICD-10-CM | POA: Diagnosis not present

## 2015-10-15 DIAGNOSIS — Y999 Unspecified external cause status: Secondary | ICD-10-CM | POA: Diagnosis not present

## 2015-10-15 DIAGNOSIS — Z23 Encounter for immunization: Secondary | ICD-10-CM | POA: Insufficient documentation

## 2015-10-15 DIAGNOSIS — W228XXA Striking against or struck by other objects, initial encounter: Secondary | ICD-10-CM | POA: Insufficient documentation

## 2015-10-15 DIAGNOSIS — F17210 Nicotine dependence, cigarettes, uncomplicated: Secondary | ICD-10-CM | POA: Diagnosis not present

## 2015-10-15 DIAGNOSIS — I10 Essential (primary) hypertension: Secondary | ICD-10-CM | POA: Insufficient documentation

## 2015-10-15 DIAGNOSIS — Y929 Unspecified place or not applicable: Secondary | ICD-10-CM | POA: Diagnosis not present

## 2015-10-15 MED ORDER — TETANUS-DIPHTH-ACELL PERTUSSIS 5-2.5-18.5 LF-MCG/0.5 IM SUSP
0.5000 mL | Freq: Once | INTRAMUSCULAR | Status: AC
  Administered 2015-10-15: 0.5 mL via INTRAMUSCULAR
  Filled 2015-10-15: qty 0.5

## 2015-10-15 MED ORDER — BACITRACIN-NEOMYCIN-POLYMYXIN 400-5-5000 EX OINT
TOPICAL_OINTMENT | Freq: Once | CUTANEOUS | Status: AC
  Administered 2015-10-15: 1 via TOPICAL
  Filled 2015-10-15: qty 1

## 2015-10-15 NOTE — ED Triage Notes (Signed)
Pt states he got mad today and punched a wall.

## 2015-10-15 NOTE — ED Provider Notes (Signed)
[facility]-EM[facility]GENCY DEPT Provider Note   CSN: [id number] Arrival date & time: 10/15/15  1241  First Provider Contact:  First MD Initiated Contact with Patient 10/15/15 1357        History   Chief Complaint Chief Complaint  Patient presents with  . Hand Injury    HPI [name] Larson is a 35 y.o. male.  Patient is a 35 year old male who presents to the emergency department with a complaint of injury to the left hand.  The patient states that he became angry earlier during the day and hit a picture frame on. He injured the left hand. He presents to the emergency department for evaluation and to rule out fracture. The patient sustained abrasions to his hand, and he is unsure of the date of his last tetanus. There are no other injuries reported. Patient denies being on any anticoagulation medications. There's been no recent operations or procedures involving the left hand.      Past Medical History:  Diagnosis Date  . Anxiety   . DVT (deep venous thrombosis) (HCC)   . Hypertension   . Klinefelter syndrome   . Moderate intellectual disability   . Neuroleptic malignant syndrome   . Neutropenia (HCC)   . Renal disorder    kideny failure  . Schizophrenia (HCC)   . Sleep apnea     Patient Active Problem List   Diagnosis Date Noted  . MR (mental retardation), moderate 08/05/2013  . Conduct disorder 08/05/2013  . Schizoaffective disorder (HCC) 01/30/2007  . BIPOLAR DISORDER UNSPECIFIED 01/30/2007  . DEPRESSION 01/30/2007  . INTERMITTENT EXPLOSIVE DISORDER 01/30/2007  . NEUROLEPTIC MALIGNANT SYNDROME 01/30/2007  . ASTHMA 01/30/2007  . KLINEFELTERS SYNDROME 01/30/2007  . SLEEP APNEA 01/30/2007    History reviewed. No pertinent surgical history.     Home Medications    Prior to Admission medications   Medication Sig Start Date End Date Taking? Authorizing Provider  gemfibrozil (LOPID) 600 MG tablet Take 600 mg by mouth 2 (two) times daily before a meal.    Historical Provider,  MD  HYDROcodone-acetaminophen (NORCO/VICODIN) 5-325 MG tablet Take 1 tablet by mouth every 4 (four) hours as needed. Patient not taking: Reported on 07/12/2015 07/05/15   Burgess Amor, PA-C  hydrOXYzine (VISTARIL) 25 MG capsule Take 25 mg by mouth daily.    Historical Provider, MD  lovastatin (MEVACOR) 40 MG tablet Take 40 mg by mouth at bedtime.    Historical Provider, MD  loxapine (LOXITANE) 10 MG capsule Take 10 mg by mouth 2 (two) times daily.    Historical Provider, MD  miconazole (ZEASORB-AF) 2 % powder Apply 1 application topically 2 (two) times daily. 11/18/13   Historical Provider, MD  mometasone (NASONEX) 50 MCG/ACT nasal spray Place 2 sprays into the nose daily.    Historical Provider, MD  Omega-3 Fatty Acids (FISH OIL) 1000 MG CAPS Take 4,000 mg by mouth daily.    Historical Provider, MD  paliperidone (INVEGA SUSTENNA) 234 MG/1.5ML SUSP injection Inject 234 mg into the muscle every 30 (thirty) days.     Historical Provider, MD  rivaroxaban (XARELTO) 20 MG TABS tablet Take 20 mg by mouth daily.     Historical Provider, MD  sodium bicarbonate 650 MG tablet Take 650 mg by mouth 2 (two) times daily.    Historical Provider, MD    Family History History reviewed. No pertinent family history.  Social History Social History  Substance Use Topics  . Smoking status: Current Every Day Smoker    Packs/day: 0.50  Types: Cigarettes  . Smokeless tobacco: Not on file  . Alcohol use No     Allergies   Lamotrigine; Clozaril [clozapine]; and Klonopin [clonazepam]   Review of Systems Review of Systems  Skin: Positive for wound.  Psychiatric/Behavioral:       Schizoaffective disorder and bipolar disorder.  All other systems reviewed and are negative.    Physical Exam Updated Vital Signs BP 137/91 (BP Location: Right Arm)   Pulse 101   Temp 98.7 F (37.1 C) (Oral)   Resp 18   Ht  (1.905 m)   Wt 123.5 kg   SpO2 97%   BMI 34.04 kg/m   Physical Exam  Constitutional: He is  oriented to person, place, and time. He appears well-developed and well-nourished.  Non-toxic appearance.  HENT:  Head: Normocephalic.  Right Ear: Tympanic membrane and external ear normal.  Left Ear: Tympanic membrane and external ear normal.  Eyes: EOM and lids are normal. Pupils are equal, round, and reactive to light.  Neck: Normal range of motion. Neck supple. Carotid bruit is not present.  Cardiovascular: Normal rate, regular rhythm, normal heart sounds, intact distal pulses and normal pulses.   Pulmonary/Chest: Breath sounds normal. No respiratory distress.  Abdominal: Soft. Bowel sounds are normal. There is no tenderness. There is no guarding.  Musculoskeletal: Normal range of motion.  There is full range of motion of the left shoulder elbow and wrist. There are abrasions to the left fifth MP joint area, and the dorsum of the left middle finger on. There is full range of motion of the fingers. There is no noted deformity. Capillary refill is less than 2 seconds. Radial pulses 2+. There is no pain in the anatomical snuffbox.  Lymphadenopathy:       Head (right side): No submandibular adenopathy present.       Head (left side): No submandibular adenopathy present.    He has no cervical adenopathy.  Neurological: He is alert and oriented to person, place, and time. He has normal strength. No cranial nerve deficit or sensory deficit.  Skin: Skin is warm and dry.  Psychiatric: He has a normal mood and affect. His speech is normal.  Nursing note and vitals reviewed.    ED Treatments / Results  Labs (all labs ordered are listed, but only abnormal results are displayed) Labs Reviewed - No data to display  EKG  EKG Interpretation None       Radiology Dg Hand Complete Left  Result Date: 10/15/2015 CLINICAL DATA:  Hip frame of window with left hand. EXAM: LEFT HAND - COMPLETE 3+ VIEW COMPARISON:  09/25/2008 FINDINGS: There is no evidence of fracture or dislocation. There is no  evidence of arthropathy or other focal bone abnormality. Soft tissues are unremarkable. IMPRESSION: Negative. Electronically Signed   By: Signa Kell M.D.   On: 10/15/2015 13:11    Procedures Procedures (including critical care time)  Medications Ordered in ED Medications  Tdap (BOOSTRIX) injection 0.5 mL (0.5 mLs Intramuscular Given 10/15/15 1402)  neomycin-bacitracin-polymyxin (NEOSPORIN) ointment (1 application Topical Given 10/15/15 1402)     Initial Impression / Assessment and Plan / ED Course  I have reviewed the triage vital signs and the nursing notes.  Pertinent labs & imaging results that were available during my care of the patient were reviewed by me and considered in my medical decision making (see chart for details).  Clinical Course    **I have reviewed nursing notes, vital signs, and all appropriate lab and  imaging results for this patient.*  Final Clinical Impressions(s) / ED Diagnoses  X-Jarriel of the left hand is negative for fracture or dislocation. Neosporin dressing was applied to the abrasion areas. Patient's tetanus status was updated. The patient is to follow with his primary physician if any additional problems.    Final diagnoses:  None    New Prescriptions New Prescriptions   No medications on file     Ivery Quale, PA-C 10/15/15 1411    Vanetta Mulders, MD 10/17/15 [phone]

## 2015-10-15 NOTE — Discharge Instructions (Signed)
The x-Lawson of your hand is negative for fracture or dislocation. Please apply Neosporin to the abrasions on your hand on. He see your primary physician for follow-up and recheck if not improving.

## 2022-02-15 ENCOUNTER — Other Ambulatory Visit: Payer: Self-pay

## 2022-02-15 ENCOUNTER — Emergency Department (HOSPITAL_COMMUNITY)
Admission: EM | Admit: 2022-02-15 | Discharge: 2022-02-15 | Discharge status: Against Medical Advice | Payer: Medicaid Other | Attending: Emergency Medicine | Admitting: Emergency Medicine

## 2022-02-15 DIAGNOSIS — Z5321 Procedure and treatment not carried out due to patient leaving prior to being seen by health care provider: Secondary | ICD-10-CM | POA: Diagnosis not present

## 2022-02-15 DIAGNOSIS — M79606 Pain in leg, unspecified: Secondary | ICD-10-CM | POA: Insufficient documentation

## 2022-02-15 DIAGNOSIS — M545 Low back pain, unspecified: Secondary | ICD-10-CM | POA: Insufficient documentation

## 2022-02-15 DIAGNOSIS — R309 Painful micturition, unspecified: Secondary | ICD-10-CM | POA: Diagnosis not present

## 2022-02-15 LAB — URINALYSIS, ROUTINE W REFLEX MICROSCOPIC
Bacteria, UA: NONE SEEN
Bilirubin Urine: NEGATIVE
Glucose, UA: 150 mg/dL — AB
Hgb urine dipstick: NEGATIVE
Ketones, ur: NEGATIVE mg/dL
Nitrite: NEGATIVE
Protein, ur: 30 mg/dL — AB
Specific Gravity, Urine: 1.009 (ref 1.005–1.030)
pH: 7 (ref 5.0–8.0)

## 2022-02-15 NOTE — ED Triage Notes (Signed)
Pt arrives via [facility], per caregiver he was diagnosed with UTI at [facility], just finished his abx yesterday. Now having lower back pain and leg pain. En route vitals 134/64, hr 104, 18rr, 93% RA, cbg 124.    [name] Larson is pt primary contact [phone]

## 2022-02-15 NOTE — ED Notes (Signed)
Pt was called at 0940 and at this time w/o answer.  Due to extended time between attempts, it is assumed he left.

## 2022-02-15 NOTE — ED Triage Notes (Signed)
Pt says that he is having lower back pain, leg pain and burning with urination. Denies fevers.

## 2024-02-25 ENCOUNTER — Encounter: Payer: MEDICAID | Admitting: Physician Assistant

## 2024-02-26 NOTE — Progress Notes (Signed)
 Was cancelled by pt
